# Patient Record
Sex: Male | Born: 1955 | Race: White | Hispanic: No | Marital: Single
Health system: Southern US, Community
[De-identification: ages and names within clinical notes are randomized; demographics above are authoritative.]

## PROBLEM LIST (undated history)

## (undated) DIAGNOSIS — K219 Gastro-esophageal reflux disease without esophagitis: Secondary | ICD-10-CM

## (undated) DIAGNOSIS — I10 Essential (primary) hypertension: Secondary | ICD-10-CM

## (undated) DIAGNOSIS — E119 Type 2 diabetes mellitus without complications: Secondary | ICD-10-CM

## (undated) DIAGNOSIS — E785 Hyperlipidemia, unspecified: Secondary | ICD-10-CM

---

## 1998-09-09 ENCOUNTER — Inpatient Hospital Stay (HOSPITAL_COMMUNITY): Admission: AD | Admit: 1998-09-09 | Discharge: 1998-09-10 | Payer: Self-pay | Admitting: Cardiology

## 2009-04-06 ENCOUNTER — Ambulatory Visit: Payer: Self-pay | Admitting: Cardiology

## 2009-04-07 ENCOUNTER — Encounter: Payer: Self-pay | Admitting: Cardiology

## 2009-04-28 ENCOUNTER — Telehealth (INDEPENDENT_AMBULATORY_CARE_PROVIDER_SITE_OTHER): Payer: Self-pay | Admitting: *Deleted

## 2009-05-27 ENCOUNTER — Ambulatory Visit: Payer: Self-pay | Admitting: Cardiology

## 2009-05-27 DIAGNOSIS — I2510 Atherosclerotic heart disease of native coronary artery without angina pectoris: Secondary | ICD-10-CM | POA: Insufficient documentation

## 2009-05-27 DIAGNOSIS — I10 Essential (primary) hypertension: Secondary | ICD-10-CM

## 2009-07-06 ENCOUNTER — Telehealth (INDEPENDENT_AMBULATORY_CARE_PROVIDER_SITE_OTHER): Payer: Self-pay | Admitting: *Deleted

## 2010-06-28 NOTE — Progress Notes (Signed)
Summary: RX REFILL TOPROL XL       New/Updated Medications: TOPROL XL 25 MG XR24H-TAB (METOPROLOL SUCCINATE) Take 1 tablet by mouth once a day Prescriptions: TOPROL XL 25 MG XR24H-TAB (METOPROLOL SUCCINATE) Take 1 tablet by mouth once a day  #30 x 6   Entered by:   Carlye Grippe   Authorized by:   Loreli Slot, MD, [facility]   Signed by:   Carlye Grippe on 07/06/2009   Method used:   Electronically to        [facility] (retail)       [address]       [place], [place]  [address]       Ph: [phone]       Fax: [phone]   RxID:   [id number]

## 2010-06-28 NOTE — Letter (Signed)
Summary: [facility] D/C DR. Donzetta Sprung  [facility] D/C DR. Donzetta Sprung   Imported By: Zachary George 05/26/2009 15:40:02  _____________________________________________________________________  External Attachment:    Type:   Image     Comment:   External Document

## 2015-01-12 ENCOUNTER — Inpatient Hospital Stay (HOSPITAL_COMMUNITY)
Admission: AD | Admit: 2015-01-12 | Discharge: 2015-01-28 | DRG: 070 | Disposition: E | Payer: BLUE CROSS/BLUE SHIELD | Source: Other Acute Inpatient Hospital | Attending: Pulmonary Disease | Admitting: Pulmonary Disease

## 2015-01-12 DIAGNOSIS — A419 Sepsis, unspecified organism: Secondary | ICD-10-CM | POA: Diagnosis not present

## 2015-01-12 DIAGNOSIS — E2740 Unspecified adrenocortical insufficiency: Secondary | ICD-10-CM | POA: Diagnosis not present

## 2015-01-12 DIAGNOSIS — I2699 Other pulmonary embolism without acute cor pulmonale: Secondary | ICD-10-CM | POA: Diagnosis not present

## 2015-01-12 DIAGNOSIS — J9601 Acute respiratory failure with hypoxia: Secondary | ICD-10-CM | POA: Diagnosis not present

## 2015-01-12 DIAGNOSIS — F1010 Alcohol abuse, uncomplicated: Secondary | ICD-10-CM | POA: Diagnosis not present

## 2015-01-12 DIAGNOSIS — J9690 Respiratory failure, unspecified, unspecified whether with hypoxia or hypercapnia: Secondary | ICD-10-CM

## 2015-01-12 DIAGNOSIS — I959 Hypotension, unspecified: Secondary | ICD-10-CM | POA: Diagnosis present

## 2015-01-12 DIAGNOSIS — E785 Hyperlipidemia, unspecified: Secondary | ICD-10-CM | POA: Diagnosis not present

## 2015-01-12 DIAGNOSIS — Z794 Long term (current) use of insulin: Secondary | ICD-10-CM | POA: Diagnosis not present

## 2015-01-12 DIAGNOSIS — R110 Nausea: Secondary | ICD-10-CM

## 2015-01-12 DIAGNOSIS — G9341 Metabolic encephalopathy: Secondary | ICD-10-CM | POA: Diagnosis present

## 2015-01-12 DIAGNOSIS — R001 Bradycardia, unspecified: Secondary | ICD-10-CM | POA: Diagnosis not present

## 2015-01-12 DIAGNOSIS — I248 Other forms of acute ischemic heart disease: Secondary | ICD-10-CM | POA: Diagnosis present

## 2015-01-12 DIAGNOSIS — E119 Type 2 diabetes mellitus without complications: Secondary | ICD-10-CM | POA: Diagnosis not present

## 2015-01-12 DIAGNOSIS — E1011 Type 1 diabetes mellitus with ketoacidosis with coma: Secondary | ICD-10-CM | POA: Diagnosis not present

## 2015-01-12 DIAGNOSIS — Z7982 Long term (current) use of aspirin: Secondary | ICD-10-CM | POA: Diagnosis not present

## 2015-01-12 DIAGNOSIS — E876 Hypokalemia: Secondary | ICD-10-CM | POA: Diagnosis not present

## 2015-01-12 DIAGNOSIS — I10 Essential (primary) hypertension: Secondary | ICD-10-CM | POA: Diagnosis not present

## 2015-01-12 DIAGNOSIS — E872 Acidosis: Secondary | ICD-10-CM | POA: Diagnosis not present

## 2015-01-12 DIAGNOSIS — R4182 Altered mental status, unspecified: Secondary | ICD-10-CM | POA: Diagnosis present

## 2015-01-12 DIAGNOSIS — I469 Cardiac arrest, cause unspecified: Secondary | ICD-10-CM | POA: Diagnosis not present

## 2015-01-12 DIAGNOSIS — E1110 Type 2 diabetes mellitus with ketoacidosis without coma: Secondary | ICD-10-CM | POA: Diagnosis present

## 2015-01-12 DIAGNOSIS — K7460 Unspecified cirrhosis of liver: Secondary | ICD-10-CM | POA: Diagnosis present

## 2015-01-12 DIAGNOSIS — G9340 Encephalopathy, unspecified: Secondary | ICD-10-CM | POA: Diagnosis not present

## 2015-01-12 DIAGNOSIS — D696 Thrombocytopenia, unspecified: Secondary | ICD-10-CM | POA: Diagnosis not present

## 2015-01-12 DIAGNOSIS — K219 Gastro-esophageal reflux disease without esophagitis: Secondary | ICD-10-CM | POA: Diagnosis present

## 2015-01-12 DIAGNOSIS — E0810 Diabetes mellitus due to underlying condition with ketoacidosis without coma: Secondary | ICD-10-CM | POA: Diagnosis not present

## 2015-01-12 DIAGNOSIS — Z01818 Encounter for other preprocedural examination: Secondary | ICD-10-CM

## 2015-01-12 DIAGNOSIS — Z4659 Encounter for fitting and adjustment of other gastrointestinal appliance and device: Secondary | ICD-10-CM

## 2015-01-12 DIAGNOSIS — J988 Other specified respiratory disorders: Secondary | ICD-10-CM | POA: Diagnosis not present

## 2015-01-12 DIAGNOSIS — R40241 Glasgow coma scale score 13-15: Secondary | ICD-10-CM | POA: Diagnosis not present

## 2015-01-12 HISTORY — DX: Essential (primary) hypertension: I10

## 2015-01-12 HISTORY — DX: Type 2 diabetes mellitus without complications: E11.9

## 2015-01-12 HISTORY — DX: Gastro-esophageal reflux disease without esophagitis: K21.9

## 2015-01-12 HISTORY — DX: Hyperlipidemia, unspecified: E78.5

## 2015-01-13 ENCOUNTER — Inpatient Hospital Stay (HOSPITAL_COMMUNITY): Payer: BLUE CROSS/BLUE SHIELD

## 2015-01-13 ENCOUNTER — Encounter (HOSPITAL_COMMUNITY): Payer: Self-pay | Admitting: Radiology

## 2015-01-13 DIAGNOSIS — E1110 Type 2 diabetes mellitus with ketoacidosis without coma: Secondary | ICD-10-CM | POA: Diagnosis present

## 2015-01-13 DIAGNOSIS — E1011 Type 1 diabetes mellitus with ketoacidosis with coma: Secondary | ICD-10-CM

## 2015-01-13 DIAGNOSIS — R6520 Severe sepsis without septic shock: Secondary | ICD-10-CM

## 2015-01-13 DIAGNOSIS — J9601 Acute respiratory failure with hypoxia: Secondary | ICD-10-CM

## 2015-01-13 DIAGNOSIS — G9340 Encephalopathy, unspecified: Secondary | ICD-10-CM

## 2015-01-13 DIAGNOSIS — A419 Sepsis, unspecified organism: Secondary | ICD-10-CM

## 2015-01-13 DIAGNOSIS — R40241 Glasgow coma scale score 13-15: Secondary | ICD-10-CM

## 2015-01-13 DIAGNOSIS — R4182 Altered mental status, unspecified: Secondary | ICD-10-CM | POA: Insufficient documentation

## 2015-01-13 LAB — PROCALCITONIN: Procalcitonin: <0.1 ng/mL

## 2015-01-13 LAB — CBC WITH DIFFERENTIAL/PLATELET
BASOS PCT: 0 % (ref 0–1)
Basophils Absolute: 0 10*3/uL (ref 0.0–0.1)
Eosinophils Absolute: 0 10*3/uL (ref 0.0–0.7)
Eosinophils Relative: 0 % (ref 0–5)
HEMATOCRIT: 42.5 % (ref 39.0–52.0)
HEMOGLOBIN: 15.4 g/dL (ref 13.0–17.0)
LYMPHS ABS: 1.9 10*3/uL (ref 0.7–4.0)
LYMPHS PCT: 19 % (ref 12–46)
MCH: 31.2 pg (ref 26.0–34.0)
MCHC: 36.2 g/dL — AB (ref 30.0–36.0)
MCV: 86 fL (ref 78.0–100.0)
MONO ABS: 1.2 10*3/uL — AB (ref 0.1–1.0)
MONOS PCT: 12 % (ref 3–12)
NEUTROS ABS: 7 10*3/uL (ref 1.7–7.7)
NEUTROS PCT: 69 % (ref 43–77)
Platelets: 193 10*3/uL (ref 150–400)
RBC: 4.94 MIL/uL (ref 4.22–5.81)
RDW: 13.2 % (ref 11.5–15.5)
WBC: 10.1 10*3/uL (ref 4.0–10.5)

## 2015-01-13 LAB — URINALYSIS, ROUTINE W REFLEX MICROSCOPIC
Glucose, UA: >1000 mg/dL — AB
Ketones, ur: >80 mg/dL — AB
Leukocytes, UA: NEGATIVE
NITRITE: NEGATIVE
PH: 6.5 (ref 5.0–8.0)
Protein, ur: 100 mg/dL — AB
SPECIFIC GRAVITY, URINE: 1.015 (ref 1.005–1.030)
UROBILINOGEN UA: 1 mg/dL (ref 0.0–1.0)

## 2015-01-13 LAB — MAGNESIUM
MAGNESIUM: 1.7 mg/dL (ref 1.7–2.4)
MAGNESIUM: 1.6 mg/dL — AB (ref 1.7–2.4)
Magnesium: 1.7 mg/dL (ref 1.7–2.4)
Magnesium: 1.6 mg/dL — ABNORMAL LOW (ref 1.7–2.4)

## 2015-01-13 LAB — COMPREHENSIVE METABOLIC PANEL WITH GFR
ALT: 16 U/L — ABNORMAL LOW (ref 17–63)
AST: 20 U/L (ref 15–41)
Albumin: 3.3 g/dL — ABNORMAL LOW (ref 3.5–5.0)
Alkaline Phosphatase: 56 U/L (ref 38–126)
Anion gap: 14 (ref 5–15)
BUN: 8 mg/dL (ref 6–20)
CO2: 21 mmol/L — ABNORMAL LOW (ref 22–32)
Calcium: 8.7 mg/dL — ABNORMAL LOW (ref 8.9–10.3)
Chloride: 111 mmol/L (ref 101–111)
Creatinine, Ser: 0.99 mg/dL (ref 0.61–1.24)
GFR calc Af Amer: >60 mL/min
GFR calc non Af Amer: >60 mL/min
Glucose, Bld: 311 mg/dL — ABNORMAL HIGH (ref 65–99)
Potassium: 3.5 mmol/L (ref 3.5–5.1)
Sodium: 146 mmol/L — ABNORMAL HIGH (ref 135–145)
Total Bilirubin: 1.5 mg/dL — ABNORMAL HIGH (ref 0.3–1.2)
Total Protein: 5.9 g/dL — ABNORMAL LOW (ref 6.5–8.1)

## 2015-01-13 LAB — LACTIC ACID, PLASMA: Lactic Acid, Venous: 1.1 mmol/L (ref 0.5–2.0)

## 2015-01-13 LAB — CG4 I-STAT (LACTIC ACID): Lactic Acid, Venous: 0.64 mmol/L (ref 0.5–2.0)

## 2015-01-13 LAB — CSF CELL COUNT WITH DIFFERENTIAL
EOS CSF: NONE SEEN % (ref 0–1)
Eosinophils, CSF: NONE SEEN % (ref 0–1)
RBC COUNT CSF: 225 /mm3 — AB
RBC COUNT CSF: 540 /mm3 — AB
Tube #: 1
Tube #: 4
WBC CSF: 1 /mm3 (ref 0–5)
WBC, CSF: 0 /mm3 (ref 0–5)

## 2015-01-13 LAB — BASIC METABOLIC PANEL
ANION GAP: 10 (ref 5–15)
BUN: 10 mg/dL (ref 6–20)
CALCIUM: 8.2 mg/dL — AB (ref 8.9–10.3)
CO2: 21 mmol/L — AB (ref 22–32)
CREATININE: 1.43 mg/dL — AB (ref 0.61–1.24)
Chloride: 112 mmol/L — ABNORMAL HIGH (ref 101–111)
GFR, EST NON AFRICAN AMERICAN: 53 mL/min — AB (ref >60)
Glucose, Bld: 246 mg/dL — ABNORMAL HIGH (ref 65–99)
Potassium: 3.2 mmol/L — ABNORMAL LOW (ref 3.5–5.1)
SODIUM: 143 mmol/L (ref 135–145)

## 2015-01-13 LAB — TROPONIN I
TROPONIN I: 0.54 ng/mL — AB (ref <0.031)
TROPONIN I: 0.4 ng/mL — AB (ref <0.031)
Troponin I: 0.41 ng/mL — ABNORMAL HIGH (ref <0.031)

## 2015-01-13 LAB — CBC
HCT: 39.6 % (ref 39.0–52.0)
HEMOGLOBIN: 14.3 g/dL (ref 13.0–17.0)
MCH: 31 pg (ref 26.0–34.0)
MCHC: 36.1 g/dL — ABNORMAL HIGH (ref 30.0–36.0)
MCV: 85.9 fL (ref 78.0–100.0)
PLATELETS: 200 10*3/uL (ref 150–400)
RBC: 4.61 MIL/uL (ref 4.22–5.81)
RDW: 13.4 % (ref 11.5–15.5)
WBC: 8.8 10*3/uL (ref 4.0–10.5)

## 2015-01-13 LAB — GLUCOSE, CAPILLARY
GLUCOSE-CAPILLARY: 201 mg/dL — AB (ref 65–99)
Glucose-Capillary: 226 mg/dL — ABNORMAL HIGH (ref 65–99)
Glucose-Capillary: 311 mg/dL — ABNORMAL HIGH (ref 65–99)
Glucose-Capillary: 281 mg/dL — ABNORMAL HIGH (ref 65–99)
Glucose-Capillary: 221 mg/dL — ABNORMAL HIGH (ref 65–99)
Glucose-Capillary: 253 mg/dL — ABNORMAL HIGH (ref 65–99)

## 2015-01-13 LAB — BLOOD GAS, ARTERIAL
ACID-BASE DEFICIT: 2.4 mmol/L — AB (ref 0.0–2.0)
Bicarbonate: 21 mEq/L (ref 20.0–24.0)
Drawn by: 252031
O2 Content: 2 L/min
O2 SAT: 97.6 %
PATIENT TEMPERATURE: 100.1
TCO2: 22 mmol/L (ref 0–100)
pCO2 arterial: 32.3 mmHg — ABNORMAL LOW (ref 35.0–45.0)
pH, Arterial: 7.433 (ref 7.350–7.450)
pO2, Arterial: 98.7 mmHg (ref 80.0–100.0)

## 2015-01-13 LAB — PHOSPHORUS
PHOSPHORUS: 2.3 mg/dL — AB (ref 2.5–4.6)
PHOSPHORUS: 2.8 mg/dL (ref 2.5–4.6)
PHOSPHORUS: 2.5 mg/dL (ref 2.5–4.6)
Phosphorus: 2.4 mg/dL — ABNORMAL LOW (ref 2.5–4.6)

## 2015-01-13 LAB — CORTISOL: Cortisol, Plasma: 10 ug/dL

## 2015-01-13 LAB — BETA-HYDROXYBUTYRIC ACID: Beta-Hydroxybutyric Acid: 3.33 mmol/L — ABNORMAL HIGH (ref 0.05–0.27)

## 2015-01-13 LAB — MRSA PCR SCREENING: MRSA BY PCR: NEGATIVE

## 2015-01-13 LAB — PROTEIN AND GLUCOSE, CSF
Glucose, CSF: 167 mg/dL — ABNORMAL HIGH (ref 40–70)
Total  Protein, CSF: 54 mg/dL — ABNORMAL HIGH (ref 15–45)

## 2015-01-13 LAB — VITAMIN B12: Vitamin B-12: 1147 pg/mL — ABNORMAL HIGH (ref 180–914)

## 2015-01-13 LAB — TSH: TSH: 0.97 u[IU]/mL (ref 0.350–4.500)

## 2015-01-13 LAB — URINE MICROSCOPIC-ADD ON

## 2015-01-13 LAB — AMMONIA: Ammonia: 35 umol/L (ref 9–35)

## 2015-01-13 MED ORDER — LACTULOSE 10 GM/15ML PO SOLN
20.0000 g | Freq: Two times a day (BID) | ORAL | Status: DC
  Filled 2015-01-13: qty 30

## 2015-01-13 MED ORDER — SODIUM CHLORIDE 0.9 % IV SOLN
500.0000 mg | Freq: Three times a day (TID) | INTRAVENOUS | Status: DC
  Administered 2015-01-13 – 2015-01-14 (×3): 500 mg via INTRAVENOUS
  Filled 2015-01-13 (×4): qty 500

## 2015-01-13 MED ORDER — SODIUM CHLORIDE 0.9 % IV SOLN
25.0000 ug/h | INTRAVENOUS | Status: DC
  Administered 2015-01-13: 50 ug/h via INTRAVENOUS
  Administered 2015-01-14: 250 ug/h via INTRAVENOUS
  Filled 2015-01-13 (×3): qty 50

## 2015-01-13 MED ORDER — HYDROCORTISONE NA SUCCINATE PF 100 MG IJ SOLR
50.0000 mg | Freq: Four times a day (QID) | INTRAMUSCULAR | Status: DC
  Administered 2015-01-13 – 2015-01-15 (×9): 50 mg via INTRAVENOUS
  Filled 2015-01-13 (×2): qty 1
  Filled 2015-01-13 (×2): qty 2
  Filled 2015-01-13 (×6): qty 1
  Filled 2015-01-13 (×2): qty 2

## 2015-01-13 MED ORDER — VANCOMYCIN HCL 10 G IV SOLR
1500.0000 mg | Freq: Once | INTRAVENOUS | Status: AC
  Administered 2015-01-13: 1500 mg via INTRAVENOUS
  Filled 2015-01-13: qty 1500

## 2015-01-13 MED ORDER — VANCOMYCIN HCL IN DEXTROSE 750-5 MG/150ML-% IV SOLN
750.0000 mg | Freq: Two times a day (BID) | INTRAVENOUS | Status: DC
  Administered 2015-01-13 – 2015-01-14 (×2): 750 mg via INTRAVENOUS
  Filled 2015-01-13 (×3): qty 150

## 2015-01-13 MED ORDER — SODIUM CHLORIDE 0.9 % IV SOLN
INTRAVENOUS | Status: DC
  Administered 2015-01-13: 02:00:00 via INTRAVENOUS

## 2015-01-13 MED ORDER — MIDAZOLAM HCL 2 MG/2ML IJ SOLN
2.0000 mg | INTRAMUSCULAR | Status: DC | PRN
  Administered 2015-01-13: 2 mg via INTRAVENOUS
  Filled 2015-01-13: qty 2

## 2015-01-13 MED ORDER — SODIUM CHLORIDE 0.45 % IV SOLN
INTRAVENOUS | Status: DC
  Administered 2015-01-13 – 2015-01-14 (×3): via INTRAVENOUS

## 2015-01-13 MED ORDER — FENTANYL CITRATE (PF) 100 MCG/2ML IJ SOLN: 50.0000 ug | Freq: Once | INTRAMUSCULAR | Status: DC

## 2015-01-13 MED ORDER — FAMOTIDINE IN NACL 20-0.9 MG/50ML-% IV SOLN
20.0000 mg | Freq: Two times a day (BID) | INTRAVENOUS | Status: DC
  Administered 2015-01-13 – 2015-01-16 (×9): 20 mg via INTRAVENOUS
  Filled 2015-01-13 (×12): qty 50

## 2015-01-13 MED ORDER — ACETAMINOPHEN 160 MG/5ML PO SOLN
650.0000 mg | Freq: Once | ORAL | Status: AC
  Administered 2015-01-13: 650 mg

## 2015-01-13 MED ORDER — POTASSIUM CHLORIDE 20 MEQ/15ML (10%) PO SOLN
40.0000 meq | ORAL | Status: AC
  Administered 2015-01-13 (×2): 40 meq via ORAL
  Filled 2015-01-13 (×2): qty 30

## 2015-01-13 MED ORDER — FOLIC ACID 5 MG/ML IJ SOLN
1.0000 mg | Freq: Every day | INTRAMUSCULAR | Status: DC
  Administered 2015-01-13 – 2015-01-16 (×4): 1 mg via INTRAVENOUS
  Filled 2015-01-13 (×5): qty 0.2

## 2015-01-13 MED ORDER — ANTISEPTIC ORAL RINSE SOLUTION (CORINZ)
7.0000 mL | Freq: Four times a day (QID) | OROMUCOSAL | Status: DC
  Administered 2015-01-13 – 2015-01-16 (×12): 7 mL via OROMUCOSAL

## 2015-01-13 MED ORDER — INSULIN GLARGINE 100 UNIT/ML ~~LOC~~ SOLN
10.0000 [IU] | Freq: Every day | SUBCUTANEOUS | Status: DC
  Administered 2015-01-13: 10 [IU] via SUBCUTANEOUS
  Filled 2015-01-13 (×2): qty 0.1

## 2015-01-13 MED ORDER — THIAMINE HCL 100 MG/ML IJ SOLN
100.0000 mg | Freq: Every day | INTRAMUSCULAR | Status: DC
  Administered 2015-01-13 – 2015-01-16 (×4): 100 mg via INTRAVENOUS
  Filled 2015-01-13 (×5): qty 1

## 2015-01-13 MED ORDER — VITAL HIGH PROTEIN PO LIQD
1000.0000 mL | ORAL | Status: DC
  Filled 2015-01-13 (×2): qty 1000

## 2015-01-13 MED ORDER — ETOMIDATE 2 MG/ML IV SOLN
10.0000 mg | Freq: Once | INTRAVENOUS | Status: AC
  Administered 2015-01-13: 10 mg via INTRAVENOUS

## 2015-01-13 MED ORDER — ACETAMINOPHEN 160 MG/5ML PO SOLN
1000.0000 mg | Freq: Once | ORAL | Status: DC
  Filled 2015-01-13: qty 40

## 2015-01-13 MED ORDER — FENTANYL BOLUS VIA INFUSION
50.0000 ug | INTRAVENOUS | Status: DC | PRN
  Filled 2015-01-13: qty 50

## 2015-01-13 MED ORDER — HEPARIN SODIUM (PORCINE) 5000 UNIT/ML IJ SOLN
5000.0000 [IU] | Freq: Three times a day (TID) | INTRAMUSCULAR | Status: DC
  Administered 2015-01-13 – 2015-01-16 (×13): 5000 [IU] via SUBCUTANEOUS
  Filled 2015-01-13 (×17): qty 1

## 2015-01-13 MED ORDER — SODIUM CHLORIDE 0.9 % IV SOLN
500.0000 mg | Freq: Four times a day (QID) | INTRAVENOUS | Status: DC
  Administered 2015-01-13: 500 mg via INTRAVENOUS
  Filled 2015-01-13 (×3): qty 500

## 2015-01-13 MED ORDER — PRO-STAT SUGAR FREE PO LIQD
30.0000 mL | Freq: Two times a day (BID) | ORAL | Status: DC
  Filled 2015-01-13 (×2): qty 30

## 2015-01-13 MED ORDER — PRO-STAT SUGAR FREE PO LIQD
30.0000 mL | Freq: Two times a day (BID) | ORAL | Status: AC
  Administered 2015-01-13 (×2): 30 mL
  Filled 2015-01-13 (×2): qty 30

## 2015-01-13 MED ORDER — SODIUM CHLORIDE 0.9 % IV SOLN
INTRAVENOUS | Status: DC
  Filled 2015-01-13: qty 2.5

## 2015-01-13 MED ORDER — SODIUM CHLORIDE 0.9 % IV SOLN
INTRAVENOUS | Status: DC
Start: 2015-01-13 — End: 2015-01-13

## 2015-01-13 MED ORDER — ETOMIDATE 2 MG/ML IV SOLN
20.0000 mg | Freq: Once | INTRAVENOUS | Status: DC
Start: 2015-01-13 — End: 2015-01-14

## 2015-01-13 MED ORDER — DEXTROSE-NACL 5-0.45 % IV SOLN: INTRAVENOUS | Status: DC

## 2015-01-13 MED ORDER — ROCURONIUM BROMIDE 50 MG/5ML IV SOLN
1.0000 mg/kg | Freq: Once | INTRAVENOUS | Status: AC
  Administered 2015-01-13: 10 mg via INTRAVENOUS

## 2015-01-13 MED ORDER — FENTANYL CITRATE (PF) 100 MCG/2ML IJ SOLN
INTRAMUSCULAR | Status: AC
  Administered 2015-01-13: 100 ug
  Filled 2015-01-13: qty 2

## 2015-01-13 MED ORDER — SODIUM CHLORIDE 0.9 % IV SOLN
500.0000 mg | Freq: Once | INTRAVENOUS | Status: AC
  Administered 2015-01-13: 500 mg via INTRAVENOUS
  Filled 2015-01-13: qty 500

## 2015-01-13 MED ORDER — LACTULOSE 10 GM/15ML PO SOLN: 20.0000 g | Freq: Two times a day (BID) | ORAL | Status: DC

## 2015-01-13 MED ORDER — MIDAZOLAM HCL 2 MG/2ML IJ SOLN
INTRAMUSCULAR | Status: AC
  Administered 2015-01-13: 2 mg
  Filled 2015-01-13: qty 2

## 2015-01-13 MED ORDER — CHLORHEXIDINE GLUCONATE 0.12% ORAL RINSE (MEDLINE KIT)
15.0000 mL | Freq: Two times a day (BID) | OROMUCOSAL | Status: DC
  Administered 2015-01-13 – 2015-01-16 (×8): 15 mL via OROMUCOSAL

## 2015-01-13 MED ORDER — PHENYLEPHRINE HCL 10 MG/ML IJ SOLN
30.0000 ug/min | INTRAVENOUS | Status: DC
  Administered 2015-01-13: 30 ug/min via INTRAVENOUS
  Filled 2015-01-13 (×2): qty 4

## 2015-01-13 MED ORDER — MIDAZOLAM HCL 2 MG/2ML IJ SOLN
2.0000 mg | INTRAMUSCULAR | Status: DC | PRN
  Administered 2015-01-13: 2 mg via INTRAVENOUS
  Filled 2015-01-13 (×3): qty 2

## 2015-01-13 MED ORDER — SODIUM CHLORIDE 0.9 % IV BOLUS (SEPSIS)
1000.0000 mL | Freq: Once | INTRAVENOUS | Status: AC
  Administered 2015-01-13: 1000 mL via INTRAVENOUS

## 2015-01-13 MED ORDER — VITAL AF 1.2 CAL PO LIQD
1000.0000 mL | ORAL | Status: DC
  Administered 2015-01-13: 1000 mL
  Filled 2015-01-13 (×4): qty 1000

## 2015-01-13 MED ORDER — ASPIRIN 81 MG PO CHEW
81.0000 mg | CHEWABLE_TABLET | Freq: Every day | ORAL | Status: DC
  Administered 2015-01-13 – 2015-01-16 (×2): 81 mg
  Filled 2015-01-13 (×2): qty 1

## 2015-01-13 MED ORDER — ETOMIDATE 2 MG/ML IV SOLN
INTRAVENOUS | Status: AC
  Administered 2015-01-13: 20 mg
  Filled 2015-01-13: qty 10

## 2015-01-13 MED ORDER — PHENYLEPHRINE HCL 10 MG/ML IJ SOLN
30.0000 ug/min | INTRAMUSCULAR | Status: DC
  Administered 2015-01-13: 10 ug/min via INTRAVENOUS
  Administered 2015-01-13: 30 ug/min via INTRAVENOUS
  Filled 2015-01-13 (×2): qty 1

## 2015-01-13 MED ORDER — INSULIN ASPART 100 UNIT/ML ~~LOC~~ SOLN
0.0000 [IU] | SUBCUTANEOUS | Status: DC
  Administered 2015-01-13 (×2): 8 [IU] via SUBCUTANEOUS
  Administered 2015-01-13 (×3): 5 [IU] via SUBCUTANEOUS
  Administered 2015-01-13: 11 [IU] via SUBCUTANEOUS
  Administered 2015-01-14 (×3): 8 [IU] via SUBCUTANEOUS
  Administered 2015-01-14 (×2): 5 [IU] via SUBCUTANEOUS
  Administered 2015-01-14: 8 [IU] via SUBCUTANEOUS
  Administered 2015-01-15: 5 [IU] via SUBCUTANEOUS
  Administered 2015-01-15 (×5): 8 [IU] via SUBCUTANEOUS
  Administered 2015-01-16 (×3): 5 [IU] via SUBCUTANEOUS
  Administered 2015-01-16: 3 [IU] via SUBCUTANEOUS
  Administered 2015-01-16 (×2): 5 [IU] via SUBCUTANEOUS
  Administered 2015-01-17: 2 [IU] via SUBCUTANEOUS
  Administered 2015-01-17: 3 [IU] via SUBCUTANEOUS

## 2015-01-13 NOTE — Procedures (Signed)
Central Venous Catheter Insertion Procedure Note Josias L. Parada [id number] [birth year]  Procedure: Insertion of Central Venous Catheter Indications: Assessment of intravascular volume, Drug and/or fluid administration and Frequent blood sampling  Procedure Details Consent: Risks of procedure as well as the alternatives and risks of each were explained to the (patient/caregiver).  Consent for procedure obtained. Time Out: Verified patient identification, verified procedure, site/side was marked, verified correct patient position, special equipment/implants available, medications/allergies/relevent history reviewed, required imaging and test results available.  Performed  Maximum sterile technique was used including antiseptics, cap, gloves, gown, hand hygiene, mask and sheet. Skin prep: Chlorhexidine; local anesthetic administered A antimicrobial bonded/coated triple lumen catheter was placed in the right internal jugular vein using the Seldinger technique.  Evaluation Blood flow good Complications: No apparent complications Patient did tolerate procedure well. Chest X-ray ordered to verify placement.  CXR: pending.  Nelda Bucks 01/13/2015, 11:36 AM  [place]  Mcarthur Rossetti. Tyson Alias, MD, FACP Pgr: [phone] [facility]

## 2015-01-13 NOTE — Consult Note (Signed)
NEURO HOSPITALIST CONSULT NOTE   Referring physician: Mannam   Reason for Consult: AMS  HPI:                                                                                                                                          Tony French is an 59 y.o. male admitted to [facility] 01/09/15 with DKA with a BG of 400 after he stopped taking his insulin.  On Jan 23, 2015, he didn't have much improvement and family requested transfer to [facility] ICU. He arrived at [facility] ICU 2015/01/23 and remained altered and barely responsive with GCS of 6. He was subsequently intubated for airway protection. While at [facility] there has been concern for aspiration PNA with BC/UC and sputum culture pending. Today he was noted to become hypotensive 53/43 requiring bolus of fluid and spiked at temperature of 103. BG has been decreased to 281.    LP performed and revealed a glucose 167, protein 54, RBC 540/225, WBC 0/1 with no lymph, segs, monocytes.  AST 20/ATL 16/ ammonia 35.  MRI brain pending. EEG pending CXR shows no PNA CT head--no acute abnormality  Neurology consulted due to AMS  Past Medical History  Diagnosis Date  . DM (diabetes mellitus)   . HTN (hypertension)   . HLD (hyperlipidemia)   . GERD (gastroesophageal reflux disease)     No past surgical history on file.  Family History  Problem Relation Age of Onset  . Hypertension Mother   . Hyperlipidemia Mother   . Hyperlipidemia Father   . Hypertension Father     Social History:  has no tobacco, alcohol, and drug history on file.  Allergies  Allergen Reactions  . Statins Other (See Comments)    Myalgias  . Keflex [Cephalexin] Rash  . Penicillins Rash    MEDICATIONS:                                                                                                                     Prior to Admission:  No prescriptions prior to admission   Scheduled: . antiseptic oral rinse  7 mL Mouth Rinse QID  . aspirin  81 mg Per Tube  Daily  . chlorhexidine gluconate  15 mL Mouth Rinse BID  . etomidate  20 mg Intravenous  Once  . famotidine (PEPCID) IV  20 mg Intravenous Q12H  . feeding supplement (PRO-STAT SUGAR FREE 64)  30 mL Per Tube BID  . fentaNYL (SUBLIMAZE) injection  50 mcg Intravenous Once  . folic acid  1 mg Intravenous Daily  . heparin  5,000 Units Subcutaneous 3 times per day  . hydrocortisone sodium succinate  50 mg Intravenous Q6H  . imipenem-cilastatin  500 mg Intravenous 3 times per day  . insulin aspart  0-15 Units Subcutaneous 6 times per day  . insulin glargine  10 Units Subcutaneous QHS  . potassium chloride  40 mEq Oral Q4H  . thiamine  100 mg Intravenous Daily  . vancomycin  750 mg Intravenous Q12H     ROS:                                                                                                                                       History obtained from unobtainable from patient due to mental status   Blood pressure 89/59, pulse 88, temperature 102.4 F (39.1 C), temperature source Oral, resp. rate 16, height 5\' 8"  (1.727 m), weight 84.3 kg (185 lb 13.6 oz), SpO2 99 %.   Neurologic Examination:                                                                                                      HEENT-  Normocephalic, no lesions, without obvious abnormality.  Normal external eye and conjunctiva.  Normal TM's bilaterally.  Normal auditory canals and external ears. Normal external nose, mucus membranes and septum.  Normal pharynx. Cardiovascular- S1, S2 normal, pulses palpable throughout   Lungs- chest clear, no wheezing, rales, normal symmetric air entry Abdomen- normal findings: bowel sounds normal Extremities- no edema Lymph-no adenopathy palpable Musculoskeletal-no joint tenderness, deformity or swelling Skin-warm and dry, no hyperpigmentation, vitiligo, or suspicious lesions  Neurological Examination Mental Status: Intubated, breathing with the Vent on fentanyl and Neo for  hypotension. Follows no commands.  When agitated tends to pull both arms toward center of chest.  Cranial Nerves: II: Discs flat bilaterally; no blink to threat, pupils equal, round, reactive to light and accommodation III,IV, VI: ptosis not present, oculocephalic maneuver intact V,VII: face symmetric, corneal reflex intact bilaterally VIII: unable to assess IX,X: unable to assess XI: unable to assess [id number] Motor: Moving all extremities spontaneously and able to lift all extremities against gravity strongly.  No focality noted.    Sensory: withdraws to pain in all extremities.  Deep Tendon  Reflexes: 1+ and symmetric throughout in UE and 1+ bilateral LE and no AJ Plantars: Right: upgoing   Left: downgoing Cerebellar: unable to assess Gait: unable to assess      Lab Results: Basic Metabolic Panel:  Recent Labs Lab 01/13/15 0050 01/13/15 0519 01/13/15 1240  NA 146* 143  --   K 3.5 3.2*  --   CL 111 112*  --   CO2 21* 21*  --   GLUCOSE 311* 246*  --   BUN 8 10  --   CREATININE 0.99 1.43*  --   CALCIUM 8.7* 8.2*  --   MG 1.7 1.7 1.6*  PHOS 2.4* 2.3* 2.8    Liver Function Tests:  Recent Labs Lab 01/13/15 0050  AST 20  ALT 16*  ALKPHOS 56  BILITOT 1.5*  PROT 5.9*  ALBUMIN 3.3*   No results for input(s): LIPASE, AMYLASE in the last 168 hours.  Recent Labs Lab 01/13/15 0054  AMMONIA 35    CBC:  Recent Labs Lab 01/13/15 0050 01/13/15 0519  WBC 10.1 8.8  NEUTROABS 7.0  --   HGB 15.4 14.3  HCT 42.5 39.6  MCV 86.0 85.9  PLT 193 200    Cardiac Enzymes:  Recent Labs Lab 01/13/15 0050 01/13/15 0519 01/13/15 1230  TROPONINI 0.41* 0.54* 0.40*    Lipid Panel: No results for input(s): CHOL, TRIG, HDL, CHOLHDL, VLDL, LDLCALC in the last 168 hours.  CBG:  Recent Labs Lab 01/13/15 0004 01/13/15 0412 01/13/15 0734 01/13/15 1157  GLUCAP 311* 226* 201* 281*    Microbiology: Results for orders placed or performed during the hospital  encounter of 01/15/2015  MRSA PCR Screening     Status: None   Collection Time: 01/13/15 12:05 AM  Result Value Ref Range Status   MRSA by PCR NEGATIVE NEGATIVE Final    Comment:        The GeneXpert MRSA Assay (FDA approved for NASAL specimens only), is one component of a comprehensive MRSA colonization surveillance program. It is not intended to diagnose MRSA infection nor to guide or monitor treatment for MRSA infections.   CSF culture with Stat gram stain     Status: None (Preliminary result)   Collection Time: 01/13/15 11:58 AM  Result Value Ref Range Status   Specimen Description CSF  Final   Special Requests NO2 2CC  Final   Gram Stain   Final    CYTOSPIN SMEAR WBC PRESENT, PREDOMINANTLY MONONUCLEAR NO ORGANISMS SEEN    Culture PENDING  Incomplete   Report Status PENDING  Incomplete    Coagulation Studies: No results for input(s): LABPROT, INR in the last 72 hours.  Imaging: Ct Head Wo Contrast  01/13/2015   CLINICAL DATA:  Altered mental status  EXAM: CT HEAD WITHOUT CONTRAST  TECHNIQUE: Contiguous axial images were obtained from the base of the skull through the vertex without intravenous contrast.  COMPARISON:  None.  FINDINGS: Skull and Sinuses: Nasopharyngeal and small bilateral mastoid fluid in the setting of intubation. No sinusitis.  Orbits: No acute abnormality.  Brain: No evidence of acute infarction, hemorrhage, hydrocephalus, or mass lesion/mass effect. Intracranial atherosclerosis seen in the carotids and right vertebral artery.  IMPRESSION: 1. No acute finding. 2. Intracranial atherosclerosis.   Electronically Signed   By: Marnee Spring M.D.   On: 01/13/2015 06:33   Dg Chest Port 1 View  01/13/2015   CLINICAL DATA:  Sepsis.  Central line placement  EXAM: PORTABLE CHEST - 1 VIEW  COMPARISON:  01/13/2015  FINDINGS: Endotracheal tube remains in stable position. NG tube has been repositioned and advanced into the stomach. Right internal jugular central line is  in place with the tip in the SVC. No pneumothorax. Decreasing lung volumes on the left with increasing left base atelectasis. Minimal right base atelectasis. Heart is normal size. No effusions.  IMPRESSION: Right central line tip in the SVC. No pneumothorax. NG tube now seen entering the stomach.  Bibasilar atelectasis, increased on the left.   Electronically Signed   By: Charlett Nose M.D.   On: 01/13/2015 12:10   Dg Chest Port 1 View  01/13/2015   CLINICAL DATA:  Endotracheal tube placement.  Initial encounter.  EXAM: PORTABLE CHEST - 1 VIEW  COMPARISON:  Chest radiograph performed 01/09/2015  FINDINGS: The patient's endotracheal tube is seen ending 3-4 cm above the carina. The patient's enteric tube appears to be coiled overlying the hypopharynx, ending overlying the proximal esophagus.  Mild bibasilar atelectasis is noted. No pleural effusion or pneumothorax is seen.  The cardiomediastinal silhouette is normal in size. No acute osseous abnormalities are identified.  IMPRESSION: 1. Endotracheal tube seen ending 3-4 cm above the carina. 2. Enteric tube appears to be coiled overlying the hypopharynx, ending overlying the proximal esophagus. This should be retracted and repositioned as deemed clinically appropriate. 3. Mild bibasilar atelectasis noted.  Lungs otherwise grossly clear. These results were called by telephone at the time of interpretation on 01/13/2015 at 1:57 am to Nursing on [id number], who verbally acknowledged these results.   Electronically Signed   By: Roanna Raider M.D.   On: 01/13/2015 01:58   Dg Abd Portable 1v  01/13/2015   CLINICAL DATA:  Orogastric tube placement.  Initial encounter.  EXAM: PORTABLE ABDOMEN - 1 VIEW  COMPARISON:  None.  FINDINGS: The patient's enteric tube is seen ending overlying the body of the stomach.  The visualized bowel gas pattern is unremarkable. Scattered air and stool filled loops of colon are seen; no abnormal dilatation of small bowel loops is seen to suggest  small bowel obstruction. No free intra-abdominal air is identified, though evaluation for free air is limited on a single supine view.  The visualized osseous structures are within normal limits; the sacroiliac joints are unremarkable in appearance.  IMPRESSION: Enteric tube seen ending overlying the body of the stomach.   Electronically Signed   By: Roanna Raider M.D.   On: 01/13/2015 02:19   US Abdomen Limited Ruq  01/13/2015   CLINICAL DATA:  Nausea.  EXAM: US ABDOMEN LIMITED - RIGHT UPPER QUADRANT  COMPARISON:  Ultrasound 03/26/2011 .  FINDINGS: Gallbladder:  No gallstones or wall thickening visualized. No sonographic Murphy sign noted.  Common bile duct:  Diameter: 3.1 mm.  Liver:  No focal lesion identified. Within normal limits in parenchymal echogenicity.  IMPRESSION: Negative exam.   Electronically Signed   By: Maisie Fus  Register   On: 01/13/2015 10:29    Felicie Morn PA-C [facility] [phone]  01/13/2015, 2:49 PM  Patient seen and examined.  Clinical course and management discussed.  Necessary edits performed.  I agree with the above.  Assessment and plan of care developed and discussed below.   Assessment/Plan: 59 year old male with altered mental status after presenting in DKA.  Patient also febrile.  Although full neurological examination unable to be performed due to patient being on Fentanyl, no focality noted on neurological examination.  EEG only significant for general background slowing.  Head CT independently reviewed and shows no acute  changes.  LP does not suggest CNS infection.  HSV pending.  No information available as to urine drug screen, etc.  With fever can not rule out the possibility of an encephalopathy related to infection.  Source yet to be determined.     Recommendations: 1.  Consider start of Acyclovir until HSV results are available. 2.  Agree with MRI of the brain 3.  Agree with continued search for infection source.    Thana Farr,  MD [facility] [phone]  01/13/2015  6:35 PM

## 2015-01-13 NOTE — Progress Notes (Signed)
ANTIBIOTIC CONSULT NOTE - INITIAL  Pharmacy Consult for Imipenem  Indication: Aspiration pneumonia   Allergies not on file  Patient Measurements:   Actual body weight:   Vital Signs: Temp: 100.7 F (38.2 C) (08/17 0005) Temp Source: Rectal (08/17 0005) Intake/Output from previous day:   Intake/Output from this shift:    Labs: No results for input(s): WBC, HGB, PLT, LABCREA, CREATININE in the last 72 hours. CrCl cannot be calculated (Unknown ideal weight.). No results for input(s): VANCOTROUGH, VANCOPEAK, VANCORANDOM, GENTTROUGH, GENTPEAK, GENTRANDOM, TOBRATROUGH, TOBRAPEAK, TOBRARND, AMIKACINPEAK, AMIKACINTROU, AMIKACIN in the last 72 hours.   Microbiology: No results found for this or any previous visit (from the past 720 hour(s)).  Medical History: No past medical history on file.  Medications:  No prescriptions prior to admission   Assessment: 45 YOM admitted to [facility] on 01/09/15 for DKA and transferred to [facility] ICU for lack of improvement. Currently remains altered and barely responsive. Pharmacy consulted to start imipenem for aspiration pneumonia with allergy to penicillins. WBC wnl. Tm 100.33F. CrCl ~ 88 mL/min   8/17 BCx2>> 8/17 Trach asp>>   Goal of Therapy:  Resolution of infection   Plan:  -Start Imipenem 500 mg IV Q 6 hours -Monitor CBC, renal fx, cultures and clinical progress   Vinnie Level, PharmD., BCPS Clinical Pharmacist Pager [phone]

## 2015-01-13 NOTE — Procedures (Signed)
Arterial Catheter Insertion Procedure Note Tony French [id number] 02/16/1956  Procedure: Insertion of Arterial Catheter  Indications: Blood pressure monitoring  Procedure Details Consent: Risks of procedure as well as the alternatives and risks of each were explained to the (patient/caregiver).  Consent for procedure obtained. and Unable to obtain consent because of emergent medical necessity. Time Out: Verified patient identification, verified procedure, site/side was marked, verified correct patient position, special equipment/implants available, medications/allergies/relevent history reviewed, required imaging and test results available.  Performed  Maximum sterile technique was used including antiseptics, cap, gloves, gown, hand hygiene, mask and sheet. Skin prep: Chlorhexidine; local anesthetic administered 20 gauge catheter was inserted into right radial artery using the Seldinger technique.  Evaluation Blood flow good; BP tracing good. Complications: No apparent complications.   Tony French 01/13/2015

## 2015-01-13 NOTE — Progress Notes (Addendum)
ANTIBIOTIC CONSULT NOTE - INITIAL  Pharmacy Consult for Imipenem + Vancomycin  Indication: empiric meningitis coverage  Allergies  Allergen Reactions  . Statins Other (See Comments)    Myalgias  . Keflex [Cephalexin] Rash  . Penicillins Rash    Patient Measurements: Height:  (172.7 cm) Weight: 185 lb 13.6 oz (84.3 kg) IBW/kg (Calculated) : 68.4  Vital Signs: Temp: 103 F (39.4 C) (08/17 0738) Temp Source: Oral (08/17 0738) BP: 91/59 mmHg (08/17 0915) Pulse Rate: 100 (08/17 0915) Intake/Output from previous day: 08/16 0701 - 08/17 0700 In: 988.6 [I.V.:838.6; IV Piggyback:150] Out: 550 [Urine:550] Intake/Output from this shift: Total I/O In: 515 [I.V.:365; IV Piggyback:150] Out: -   Labs:  Recent Labs  01/13/15 0050 01/13/15 0519  WBC 10.1 8.8  HGB 15.4 14.3  PLT 193 200  CREATININE 0.99 1.43*   Estimated Creatinine Clearance: 61 mL/min (by C-G formula based on Cr of 1.43). No results for input(s): VANCOTROUGH, VANCOPEAK, VANCORANDOM, GENTTROUGH, GENTPEAK, GENTRANDOM, TOBRATROUGH, TOBRAPEAK, TOBRARND, AMIKACINPEAK, AMIKACINTROU, AMIKACIN in the last 72 hours.   Microbiology: Recent Results (from the past 720 hour(s))  MRSA PCR Screening     Status: None   Collection Time: 01/13/15 12:05 AM  Result Value Ref Range Status   MRSA by PCR NEGATIVE NEGATIVE Final    Comment:        The GeneXpert MRSA Assay (FDA approved for NASAL specimens only), is one component of a comprehensive MRSA colonization surveillance program. It is not intended to diagnose MRSA infection nor to guide or monitor treatment for MRSA infections.    Assessment:  80 YOM admitted to [facility] on 01/09/15 for DKA and transferred to [facility] ICU for lack of improvement in mental status. Currently remains altered and barely responsive with rigidity of his right arm.  WBC wnl. Tm 103F. CrCl ~ 61 mL/min   Pharmacy consulted to start imipenem for questionable aspiration pneumonia with allergy  to penicillins, now consulted to add vancomycin for empiric coverage of meningitis.   8/17 BCx2>> 8/17 Trach asp>>  8/17 LP >>  Goal of Therapy:  Resolution of infection  Vancomycin trough goal 15-20  Plan:  -Reduced Imipenem to 500 mg IV q8h d/t decline in renal fxn -Vancomycin 1500 mg IV x1, then 750 mg IV q12h -Vanc trough at Css if therapy is to continue -Monitor CBC, renal fx, cultures and clinical progress   Arcola Jansky, PharmD Clinical Pharmacy Resident Pager: [phone] 01/13/2015,10:41 AM

## 2015-01-13 NOTE — Progress Notes (Signed)
eLink Physician-Brief Progress Note Patient Name: Tony French. Kenley DOB: 07-06-1955 MRN: [id number]   Date of Service  01/13/2015  HPI/Events of Note  Hypotension improved with fluid bolus, but back down now post sedation Lactic acid reassuring  eICU Interventions  Start neosynephrine Place arterial line     Intervention Category Intermediate Interventions: Hypotension - evaluation and management  MCQUAID, DOUGLAS 01/13/2015, 4:35 AM

## 2015-01-13 NOTE — Progress Notes (Signed)
eLink Physician-Brief Progress Note Patient Name: Tony French. Sperbeck DOB: 1956-03-23 MRN: [id number]   Date of Service  01/13/2015  HPI/Events of Note  Blood Cultures positive for GPC's in clusters (1/2 bottles). Patient is currently on Vancomycin and Primaxin which will provide good coverage for GPC's in clusters.   eICU Interventions  Continue current management.      Intervention Category Major Interventions: Infection - evaluation and management  [name] 01/13/2015, 8:12 PM

## 2015-01-13 NOTE — Procedures (Signed)
Lumbar Puncture Procedure Note  Pre-operative Diagnosis: r/o meningitis  Post-operative Diagnosis: unlikely meningitis, r/o encephalitis  Indications: Diagnostic  Procedure Details   Consent: Informed consent was obtained. Risks of the procedure were discussed including: infection, bleeding, pain and headache.  The patient was positioned under sterile conditions. Betadine solution and sterile drapes were utilized. A spinal needle was inserted at the L3 - L4 interspace.  Spinal fluid was obtained and sent to the laboratory.  Findings 8mL of clear spinal fluid was obtained. Opening Pressure: 12cm H2O pressure. Closing Pressure: not measuredcm H2O pressure.  Complications:  None; patient tolerated the procedure well.        Condition: stable  Plan Bed rest for 5 hours.   Mcarthur Rossetti. Tyson Alias, MD, FACP Pgr: [phone] [facility]

## 2015-01-13 NOTE — Progress Notes (Addendum)
Initial Nutrition Assessment  DOCUMENTATION CODES:   Not applicable  INTERVENTION:    Initiate TF via OGT with Vital AF 1.2 at 25 ml/h and Prostat 30 ml BID on day 1; on day 2, d/c Prostat and increase to goal rate of 70 ml/h (1680 ml per day) to provide 2016 kcals, 126 gm protein, 1362 ml free water daily.  NUTRITION DIAGNOSIS:   Inadequate oral intake related to inability to eat as evidenced by NPO status.  GOAL:   Patient will meet greater than or equal to 90% of their needs  MONITOR:   TF tolerance, Vent status, Labs, Weight trends  REASON FOR ASSESSMENT:   Consult Enteral/tube feeding initiation and management  ASSESSMENT:   59 y.o. M admitted to [facility] 01/09/15 with DKA. On 02/03/15, he didn't have much improvement and family requested transfer to [facility] ICU. He arrived at [facility] ICU 02/03/2015 and remained altered and barely responsive with GCS of 6. He was subsequently intubated for airway protection.  Labs reviewed: potassium and phosphorus are low. Nutrition focused physical exam completed.  No muscle or subcutaneous fat depletion noticed.  Patient is currently intubated on ventilator support MV: 8.9 L/min Temp (24hrs), Avg:101.4 F (38.6 C), Min:100.4 F (38 C), Max:103 F (39.4 C)   Diet Order:  Diet NPO time specified  Skin:  Reviewed, no issues  Last BM:  8/17  Height:   Ht Readings from Last 1 Encounters:  01/13/15  (1.727 m)    Weight:   Wt Readings from Last 1 Encounters:  01/13/15 185 lb 13.6 oz (84.3 kg)    Ideal Body Weight:  70 kg  BMI:  Body mass index is 28.26 kg/(m^2).  Estimated Nutritional Needs:   Kcal:  2095  Protein:  110-125 gm  Fluid:  2.2 L  EDUCATION NEEDS:   No education needs identified at this time   Joaquin Courts, RD, LDN, CNSC Pager [phone] After Hours Pager [phone]

## 2015-01-13 NOTE — H&P (Signed)
PULMONARY / CRITICAL CARE MEDICINE   Name: Tony French. Tony French MRN: [id number] DOB: 04/08/1956    ADMISSION DATE:  February 10, 2015 CONSULTATION DATE:  01/13/2015  REFERRING MD :  [facility]  CHIEF COMPLAINT:  AMS  INITIAL PRESENTATION:  59 y.o. M admitted to [facility] 01/09/15 with DKA.  On February 10, 2015, he didn't have much improvement and family requested transfer to [facility] ICU.  He arrived at [facility] ICU 02-10-2015 and remained altered and barely responsive with GCS of 6.  He was subsequently intubated for airway protection.  STUDIES:  CXR 8/17 >>>  SIGNIFICANT EVENTS: 8/13 - admitted to [facility] with DKA. 8/16 - transferred to [facility] ICU.  Intubated for airway protection.   HISTORY OF PRESENT ILLNESS:  Pt is encephalopathic; therefore, this HPI is obtained from chart review. Tony French is a 59 y.o. M with PMH of DM, HLD, HTN, GERD.  He was admitted to [facility] on 01/09/15 for N/V/D.  He apparently had stopped taking his insulin 2 days prior to being admitted at [facility].  On day of admission, he was confused and was unable to eat.  In ED, he was found to have DKA with AG of 40.  He was subsequently admitted to [facility] for further management of his DKA.  On 02-10-2015, family did not feel that pt had much improvement since admission.  They requested that pt be transferred to [facility] for further evaluation and management.  Per discharge summary from [facility], over past 3 days, pt had been extremely agitated and difficult to control.  He required multiple doses of ativan and haldol.  He arrived to [facility] ICU during early AM hours of 01/13/15.  He remained altered and was minimally responsive with a GCS of 6 (Eyes 1, Motor 4, Verbal 1).  He was subsequently intubated for airway protection.  Head CT is pending.   PAST MEDICAL HISTORY : DM, HLD, HTN, GERD.  PTA Meds: aspirin  PO QD, glucophage 1g PO BID, Jardiance  PO QD, Lantus 70u SQ BID, Lisinopril  PO QD, Zetia  PO QD,  Ranitidine  PO BID PRN.  Prior to Admission medications   Not on File   Allergies:  Keflex (rash), Penicillin (rash), HMG COA Reductase Inhibitors (myalgias). FAMILY HISTORY:  No family history on file.  SOCIAL HISTORY:  has no tobacco, alcohol, and drug history on file.  REVIEW OF SYSTEMS:  Unable to obtain as pt is encephalopathic.  SUBJECTIVE:   VITAL SIGNS: Temp:  [100.7 F (38.2 C)] 100.7 F (38.2 C) (08/17 0005) HEMODYNAMICS:   VENTILATOR SETTINGS:   INTAKE / OUTPUT: Intake/Output    None     PHYSICAL EXAMINATION: General: Adult male, minimally responsive. Neuro: Minimally responsive, GCS 6.  RUE rigid. HEENT: [place]/AT. PERRL. Cardiovascular: RRR, no M/R/G.  Lungs: Respirations even and unlabored.  CTA bilaterally, No W/R/R. Abdomen: BS x 4, soft, NT/ND.  Musculoskeletal: No gross deformities, no edema.  Skin: Intact, warm, no rashes.  LABS:  CBC No results for input(s): WBC, HGB, HCT, PLT in the last 168 hours. Coag's No results for input(s): APTT, INR in the last 168 hours. BMET No results for input(s): NA, K, CL, CO2, BUN, CREATININE, GLUCOSE in the last 168 hours. Electrolytes No results for input(s): CALCIUM, MG, PHOS in the last 168 hours. Sepsis Markers No results for input(s): LATICACIDVEN, PROCALCITON, O2SATVEN in the last 168 hours. ABG No results for input(s): PHART, PCO2ART, PO2ART in the last 168 hours. Liver Enzymes No results for input(s): AST,  ALT, ALKPHOS, BILITOT, ALBUMIN in the last 168 hours. Cardiac Enzymes No results for input(s): TROPONINI, PROBNP in the last 168 hours. Glucose No results for input(s): GLUCAP in the last 168 hours.  Imaging No results found.   ASSESSMENT / PLAN:  PULMONARY OETT 8/17 > A: VDRF due to inability to protect airway. ? Aspiration PNA - thick secretions seen in posterior oropharynx during intubation. P:   Full mechanical support, wean as able. VAP bundle. SBT in AM if able. Abx per ID  section. Pulmonary hygiene. CXR in AM.  CARDIOVASCULAR A:  Troponin leak - suspect demand. Hx HTN, HLD. P:  Trend troponins. Monitor hemodynamics. Hold outpatient lisinopril, zetia.  RENAL A:   Mild AG metabolic acidosis. Pseudohypocalcemia - corrects to 9.26.  P:   NS @ 125. Check ionized calcium. BMP in AM.  GASTROINTESTINAL A:   ? Hx cirrhosis. Hx hepatic encephalopathy. Mild hyperbilirubinemia. GERD. Nutrition. P:   RUQ ultrasound. Check ammonia. Pepcid. NPO.  HEMATOLOGIC A:   VTE Prophylaxis. P:  SCD's / Heparin. CBC in AM.  INFECTIOUS A:   Concern for aspiration PNA - thick secretions seen in posterior pharynx during intubation. P:   BCx2 8/17 > UCx 8/17 > Sputum Cx 8/17 > Abx: Imipenem (penicillin allergy), start date 8/17, day 1/x. PCT algorithm to limit abx exposure.  ENDOCRINE A:   DM.  P:   SSI. Hold outpatient  Glucophage, Jardiance, Lantus.  NEUROLOGIC A:   Acute metabolic encephalopathy. RUE rigidity. ETOH use disorder. P:   Sedation:  Fentanyl gtt / Midazolam PRN. RASS goal: 0 to -1. Daily WUA. STAT head CT. May need LP. Check ammonia, TSH, Vitamin B12. EEG. Thiamine / Folate.   Family updated: Wife at bedside.  Interdisciplinary Family Meeting v Palliative Care Meeting:  Due by: 01/19/15.   Tony French, [place] - [facility] [place] [facility] Pager: [phone]  or [phone] 01/13/2015, 12:19 AM   Attending:  I have seen and examined the patient with nurse practitioner/resident and agree with the note above.   Tony French was admitted on 01/09/2015 to [facility] with nausea vomiting and an elevated anion gap and blood sugar with positive serum ketones. This syndrome was felt to be consistent with DKA and he was treated with IV fluids and insulin. His family noted that prior to this he had no fevers, chills, cough, dysuria, rash, or tick bites. Notably for my review from the records from  the other hospital he became more agitated and he was treated with repeated doses of Ativan. He was transferred to our facility on 01/13/2015 with severe agitation. On arrival he was attended and required intubation. His wife states that he only has about 3-4 drinks per week and denies knowledge of hepatic cirrhosis.  On my exam he had a GCS score of 6, he was not protecting his airway, his belly was soft, his lungs were clear his cardiovascular exam was within normal limits  Impression/plan: Acute encephalopathy: The most likely scenario in this situation would be polypharmacy +/-sepsis related to all the benzodiazepine's that he received at the other hospital, but it's worthwhile to work him up for other metabolic causes including cirrhosis, thyroid disease, and acute neurologic conditions. To that end we will get a CT scan of his head, and EEG, repeat an ammonia. He may need a lumbar puncture. Low-grade temperature/sepsis: The most likely scenario considering his confusion would be aspiration, we will check a chest x-ray and  add Unasyn, give IVF, draw cultures Acute respiratory failure with hypoxemia: Due to inability to protect airway, he was intubated.  My critical care time 45 minutes  Heber Palmhurst, MD [place] [facility] Pager: [phone] Cell: [phone] After 3pm or if no response, call [phone]

## 2015-01-13 NOTE — Progress Notes (Signed)
PULMONARY / CRITICAL CARE MEDICINE   Name: Tony French MRN: [id number] DOB: 02/18/1956    ADMISSION DATE:  01/02/2015 CONSULTATION DATE:  01/13/2015  REFERRING MD :  [facility]  CHIEF Uri TurnboughINT:  AMS  INITIAL PRESENTATION:  59 y.o. M admitted to [facility] 01/09/15 with DKA.  On 01/07/2015, he didn't have much improvement and family requested transfer to [facility] ICU.  He arrived at [facility] ICU 12/28/2014 and remained altered and barely responsive with GCS of 6.  He was subsequently intubated for airway protection.  STUDIES:  CXR 8/17 >>>  SIGNIFICANT EVENTS: 8/13 - admitted to [facility] with DKA. 8/16 - transferred to [facility] ICU.  Intubated for airway protection.  SUBJECTIVE: fever  VITAL SIGNS: Temp:  [100.4 F (38 C)-103 F (39.4 C)] 103 F (39.4 C) (08/17 0738) Pulse Rate:  [100-133] 100 (08/17 0915) Resp:  [18-27] 20 (08/17 0915) BP: (53-149)/(34-109) 91/59 mmHg (08/17 0915) SpO2:  [96 %-100 %] 100 % (08/17 0915) Arterial Line BP: (80-160)/(48-79) 95/55 mmHg (08/17 0930) FiO2 (%):  [50 %-100 %] 60 % (08/17 0900) Weight:  [84.3 kg (185 lb 13.6 oz)] 84.3 kg (185 lb 13.6 oz) (08/17 0415) HEMODYNAMICS:   VENTILATOR SETTINGS: Vent Mode:  [-] PRVC FiO2 (%):  [50 %-100 %] 60 % Set Rate:  [20 bmp] 20 bmp Vt Set:  [550 mL] 550 mL PEEP:  [5 cmH20] 5 cmH20 Plateau Pressure:  [15 cmH20-17 cmH20] 15 cmH20 INTAKE / OUTPUT: Intake/Output      08/16 0701 - 08/17 0700 08/17 0701 - 08/18 0700   I.V. (mL/kg) 838.6 (9.9) 365 (4.3)   IV Piggyback 150 150   Total Intake(mL/kg) 988.6 (11.7) 515 (6.1)   Urine (mL/kg/hr) 550    Total Output 550     Net +438.6 +515          PHYSICAL EXAMINATION: General: Adult male, minimally responsive. Neuro: Minimally responsive,  RUE rigid HEENT: [place]/AT. PERRL. Cardiovascular: RRR, no M/R/G.  Lungs: Respirations even and unlabored.  CTA bilaterally, No W/R/R. Abdomen: BS x 4, soft, NT/ND.  Musculoskeletal: No gross deformities, no edema  Skin: Intact,  warm, no rashes.  LABS:  CBC  Recent Labs Lab 01/13/15 0050 01/13/15 0519  WBC 10.1 8.8  HGB 15.4 14.3  HCT 42.5 39.6  PLT 193 200   Coag's No results for input(s): APTT, INR in the last 168 hours. BMET  Recent Labs Lab 01/13/15 0050 01/13/15 0519  NA 146* 143  K 3.5 3.2*  CL 111 112*  CO2 21* 21*  BUN 8 10  CREATININE 0.99 1.43*  GLUCOSE 311* 246*   Electrolytes  Recent Labs Lab 01/13/15 0050 01/13/15 0519  CALCIUM 8.7* 8.2*  MG 1.7 1.7  PHOS 2.4* 2.3*   Sepsis Markers  Recent Labs Lab 01/13/15 0053 01/13/15 0111 01/13/15 0142  LATICACIDVEN 1.1  --  0.64  PROCALCITON  --  <0.10  --    ABG  Recent Labs Lab 01/13/15 0005  PHART 7.433  PCO2ART 32.3*  PO2ART 98.7   Liver Enzymes  Recent Labs Lab 01/13/15 0050  AST 20  ALT 16*  ALKPHOS 56  BILITOT 1.5*  ALBUMIN 3.3*   Cardiac Enzymes  Recent Labs Lab 01/13/15 0050 01/13/15 0519  TROPONINI 0.41* 0.54*   Glucose  Recent Labs Lab 01/13/15 0004 01/13/15 0412 01/13/15 0734  GLUCAP 311* 226* 201*    Imaging Ct Head Wo Contrast  01/13/2015   CLINICAL DATA:  Altered mental status  EXAM: CT HEAD WITHOUT CONTRAST  TECHNIQUE: Contiguous axial images were obtained from the base of the skull through the vertex without intravenous contrast.  COMPARISON:  None.  FINDINGS: Skull and Sinuses: Nasopharyngeal and small bilateral mastoid fluid in the setting of intubation. No sinusitis.  Orbits: No acute abnormality.  Brain: No evidence of acute infarction, hemorrhage, hydrocephalus, or mass lesion/mass effect. Intracranial atherosclerosis seen in the carotids and right vertebral artery.  IMPRESSION: 1. No acute finding. 2. Intracranial atherosclerosis.   Electronically Signed   By: Marnee Spring M.D.   On: 01/13/2015 06:33   Dg Chest Port 1 View  01/13/2015   CLINICAL DATA:  Endotracheal tube placement.  Initial encounter.  EXAM: PORTABLE CHEST - 1 VIEW  COMPARISON:  Chest radiograph performed  01/09/2015  FINDINGS: The patient's endotracheal tube is seen ending 3-4 cm above the carina. The patient's enteric tube appears to be coiled overlying the hypopharynx, ending overlying the proximal esophagus.  Mild bibasilar atelectasis is noted. No pleural effusion or pneumothorax is seen.  The cardiomediastinal silhouette is normal in size. No acute osseous abnormalities are identified.  IMPRESSION: 1. Endotracheal tube seen ending 3-4 cm above the carina. 2. Enteric tube appears to be coiled overlying the hypopharynx, ending overlying the proximal esophagus. This should be retracted and repositioned as deemed clinically appropriate. 3. Mild bibasilar atelectasis noted.  Lungs otherwise grossly clear. These results were called by telephone at the time of interpretation on 01/13/2015 at 1:57 am to Nursing on [facility], who verbally acknowledged these results.   Electronically Signed   By: Roanna Raider M.D.   On: 01/13/2015 01:58   Dg Abd Portable 1v  01/13/2015   CLINICAL DATA:  Orogastric tube placement.  Initial encounter.  EXAM: PORTABLE ABDOMEN - 1 VIEW  COMPARISON:  None.  FINDINGS: The patient's enteric tube is seen ending overlying the body of the stomach.  The visualized bowel gas pattern is unremarkable. Scattered air and stool filled loops of colon are seen; no abnormal dilatation of small bowel loops is seen to suggest small bowel obstruction. No free intra-abdominal air is identified, though evaluation for free air is limited on a single supine view.  The visualized osseous structures are within normal limits; the sacroiliac joints are unremarkable in appearance.  IMPRESSION: Enteric tube seen ending overlying the body of the stomach.   Electronically Signed   By: Roanna Raider M.D.   On: 01/13/2015 02:19     ASSESSMENT / PLAN:  PULMONARY OETT 8/17 > A: VDRF due to inability to protect airway. ? Aspiration PNA - thick secretions seen in posterior oropharynx during intubation. pcxr  unimpressive P:   Full mechanical support ABg reviewed, rate 16 Unable to extubate secondary to neuro Weaning SBT attempt cpap 5 ps 5, some apnea noted, dc'ed  CARDIOVASCULAR A:  Troponin leak - suspect demand. Hx HTN, HLD. Shock, sepsis? P:  Monitor hemodynamics. Hold outpatient lisinopril, zetia. Treat fevers LA reassuring Requires cvp, line, will place cortisol  RENAL A:   Mild nonAG metabolic acidosis. Hypokalemia P:   Change to 1/2 NS with CT 112 Check ionized calcium. BMP in AM Get cvp k supp  GASTROINTESTINAL A:   ? Hx cirrhosis. Hx hepatic encephalopathy. Mild hyperbilirubinemia. GERD. Nutrition. P:   RUQ ultrasound pending Pepcid. Start TF  HEMATOLOGIC A:   VTE Prophylaxis. P:  SCD's / Heparin. CBC in AM. coags wnl at [facility]  INFECTIOUS A:   Concern for aspiration PNA - thick secretions seen in posterior pharynx during intubation. P:  BCx2 8/17 > UCx 8/17 > Sputum Cx 8/17 > Abx: Imipenem (penicillin allergy), start date 8/17>>> vanc 8/17>>> PCT algorithm sent  LP indicated, fever 103, ct head neg  ENDOCRINE A:   DM.  P:   SSI. Hold outpatient  Glucophage, Jardiance Add Lantus 10 units with start TF   NEUROLOGIC A:   Acute metabolic encephalopathy. RUE rigidity. ETOH use disorder. P:   Sedation:  Fentanyl gtt / Midazolam PRN. RASS goal: 0 to -1. Daily WUA. MRi  LP Check ammonia, TSH, Vitamin B12. EEG. Thiamine / Folate. Assess any home antipsychotics taken- none noted  Family updated: Wife at bedside updated  Interdisciplinary Family Meeting v Palliative Care Meeting:  Due by: 01/19/15.  Ccm time 45 min   Mcarthur Rossetti. Tyson Alias, MD, FACP Pgr: [phone] [place] [facility]

## 2015-01-13 NOTE — Procedures (Signed)
ELECTROENCEPHALOGRAM REPORT   Patient: Tony French. [name]      Room #: 53M-03 Age: 59 y.o.        Sex: male Referring Physician: Dr Isaiah Serge Report Date:  01/13/2015        Interpreting Physician: Omelia Blackwater  History: [name]. [name] is an 59 y.o. male admitted with DKA, now with continued AMS  Medications:  Scheduled: . antiseptic oral rinse  7 mL Mouth Rinse QID  . aspirin  81 mg Per Tube Daily  . chlorhexidine gluconate  15 mL Mouth Rinse BID  . etomidate  20 mg Intravenous Once  . famotidine (PEPCID) IV  20 mg Intravenous Q12H  . feeding supplement (PRO-STAT SUGAR FREE 64)  30 mL Per Tube BID  . fentaNYL (SUBLIMAZE) injection  50 mcg Intravenous Once  . folic acid  1 mg Intravenous Daily  . heparin  5,000 Units Subcutaneous 3 times per day  . hydrocortisone sodium succinate  50 mg Intravenous Q6H  . imipenem-cilastatin  500 mg Intravenous 3 times per day  . insulin aspart  0-15 Units Subcutaneous 6 times per day  . insulin glargine  10 Units Subcutaneous QHS  . potassium chloride  40 mEq Oral Q4H  . thiamine  100 mg Intravenous Daily  . vancomycin  750 mg Intravenous Q12H    Conditions of Recording:  This is a 16 channel EEG carried out with the patient in the intubated state.  Description:  The waking background activity consists of a low voltage, symmetrical, fairly well organized, theta activity seen from the parieto-occipital and posterior temporal regions.  Low voltage fast activity, poorly organized, is seen anteriorly and is at times superimposed on more posterior regions.  No focal slowing or epileptiform activity is noted.  Hyperventilation was not performed. Intermittent photic stimulation was not performed.  IMPRESSION: This is an abnormal EEG secondary to general background slowing.  This finding may be seen with a diffuse disturbance that is etiologically nonspecific, but may include a metabolic encephalopathy, among other possibilities.  No epileptiform  activity was noted.     Elspeth Cho, DO [facility] [phone]  If 7pm- 7am, please page neurology on call as listed in AMION. 01/13/2015, 4:31 PM

## 2015-01-13 NOTE — Progress Notes (Signed)
CRITICAL VALUE ALERT  Critical value received:  Gram Positive Cocci in Clusters in Anaerobic bottle   Date of notification: 01/13/15  Time of notification: 1948  Critical value read back: Yes  Nurse who received alert:  Clydia Llano RN MD notified (1st page):  MD Arsenio Loader    Time of first page:  2010  MD notified (2nd page):  Time of second page:  Responding MD: MD Arsenio Loader   Time MD responded: 2010

## 2015-01-13 NOTE — Progress Notes (Signed)
Bedside EEG completed, results pending. 

## 2015-01-13 NOTE — Progress Notes (Signed)
eLink Physician-Brief Progress Note Patient Name: Tony French DOB: 10-10-1955 MRN: [id number]   Date of Service  01/13/2015  HPI/Events of Note  hypotension  eICU Interventions  Saline bolus     Intervention Category Intermediate Interventions: Hypotension - evaluation and management  [name] 01/13/2015, 2:02 AM

## 2015-01-13 NOTE — Procedures (Signed)
Intubation Procedure Note Tony French [id number] 05/17/1956  Procedure: Intubation Indications: Airway protection and maintenance  Procedure Details Consent: Risks of procedure as well as the alternatives and risks of each were explained to the (patient/caregiver).  Consent for procedure obtained. Time Out: Verified patient identification, verified procedure, site/side was marked, verified correct patient position, special equipment/implants available, medications/allergies/relevent history reviewed, required imaging and test results available.  Performed  Drugs:  100 mcg Fentanyl, 2 mg Versed, 10 mg Etomidate, 50 mg Rocuronium. IDL using glidescope x 1 with # 3 blade. Grade 2 view. 7.5 tube visualized passing through vocal cords. Following intubation:  positive color change on ETCO2, condensation seen in endotracheal tube, equal breath sounds bilaterally.  Evaluation Hemodynamic Status: BP stable throughout; O2 sats: stable throughout Patient's Current Condition: stable Complications: No apparent complications Patient did tolerate procedure well. Chest X-ray ordered to verify placement.  CXR: pending.   Rutherford Guys, [place] - C [place] [facility] Pager: [phone]  or [phone] 01/13/2015, 1:40 AM  Attending: I was present for and supervised this procedure.  Heber Kear[place], MD [place] [facility] Pager: [phone] Cell: [phone] After 3pm or if no response, call [phone]

## 2015-01-14 ENCOUNTER — Inpatient Hospital Stay (HOSPITAL_COMMUNITY): Payer: BLUE CROSS/BLUE SHIELD

## 2015-01-14 DIAGNOSIS — J988 Other specified respiratory disorders: Secondary | ICD-10-CM

## 2015-01-14 LAB — COMPREHENSIVE METABOLIC PANEL
ALBUMIN: 2.6 g/dL — AB (ref 3.5–5.0)
ALK PHOS: 43 U/L (ref 38–126)
ALT: 11 U/L — AB (ref 17–63)
AST: 12 U/L — AB (ref 15–41)
Anion gap: 5 (ref 5–15)
BILIRUBIN TOTAL: 0.5 mg/dL (ref 0.3–1.2)
BUN: 28 mg/dL — AB (ref 6–20)
CALCIUM: 7.9 mg/dL — AB (ref 8.9–10.3)
CO2: 22 mmol/L (ref 22–32)
Chloride: 117 mmol/L — ABNORMAL HIGH (ref 101–111)
Creatinine, Ser: 1.09 mg/dL (ref 0.61–1.24)
GFR calc Af Amer: >60 mL/min (ref >60)
GFR calc non Af Amer: >60 mL/min (ref >60)
GLUCOSE: 332 mg/dL — AB (ref 65–99)
Potassium: 4.5 mmol/L (ref 3.5–5.1)
SODIUM: 144 mmol/L (ref 135–145)
TOTAL PROTEIN: 5 g/dL — AB (ref 6.5–8.1)

## 2015-01-14 LAB — GLUCOSE, CAPILLARY
GLUCOSE-CAPILLARY: 266 mg/dL — AB (ref 65–99)
GLUCOSE-CAPILLARY: 215 mg/dL — AB (ref 65–99)
GLUCOSE-CAPILLARY: 236 mg/dL — AB (ref 65–99)
Glucose-Capillary: 264 mg/dL — ABNORMAL HIGH (ref 65–99)
Glucose-Capillary: 285 mg/dL — ABNORMAL HIGH (ref 65–99)
Glucose-Capillary: 291 mg/dL — ABNORMAL HIGH (ref 65–99)
Glucose-Capillary: 292 mg/dL — ABNORMAL HIGH (ref 65–99)

## 2015-01-14 LAB — POCT I-STAT 3, ART BLOOD GAS (G3+)
ACID-BASE DEFICIT: 3 mmol/L — AB (ref 0.0–2.0)
Bicarbonate: 23.1 mEq/L (ref 20.0–24.0)
O2 SAT: 98 %
PH ART: 7.33 — AB (ref 7.350–7.450)
TCO2: 24 mmol/L (ref 0–100)
pCO2 arterial: 44.2 mmHg (ref 35.0–45.0)
pO2, Arterial: 109 mmHg — ABNORMAL HIGH (ref 80.0–100.0)

## 2015-01-14 LAB — CBC WITH DIFFERENTIAL/PLATELET
BASOS ABS: 0 10*3/uL (ref 0.0–0.1)
BASOS PCT: 0 % (ref 0–1)
EOS PCT: 0 % (ref 0–5)
Eosinophils Absolute: 0 10*3/uL (ref 0.0–0.7)
HCT: 34.1 % — ABNORMAL LOW (ref 39.0–52.0)
Hemoglobin: 11.6 g/dL — ABNORMAL LOW (ref 13.0–17.0)
Lymphocytes Relative: 14 % (ref 12–46)
Lymphs Abs: 1.3 10*3/uL (ref 0.7–4.0)
MCH: 30.2 pg (ref 26.0–34.0)
MCHC: 34 g/dL (ref 30.0–36.0)
MCV: 88.8 fL (ref 78.0–100.0)
MONO ABS: 0.9 10*3/uL (ref 0.1–1.0)
MONOS PCT: 10 % (ref 3–12)
Neutro Abs: 7.2 10*3/uL (ref 1.7–7.7)
Neutrophils Relative %: 76 % (ref 43–77)
PLATELETS: 130 10*3/uL — AB (ref 150–400)
RBC: 3.84 MIL/uL — ABNORMAL LOW (ref 4.22–5.81)
RDW: 14 % (ref 11.5–15.5)
WBC: 9.5 10*3/uL (ref 4.0–10.5)

## 2015-01-14 LAB — HERPES SIMPLEX VIRUS(HSV) DNA BY PCR
HSV 1 DNA: NEGATIVE
HSV 2 DNA: NEGATIVE

## 2015-01-14 LAB — PROCALCITONIN: Procalcitonin: <0.1 ng/mL

## 2015-01-14 LAB — CALCIUM, IONIZED: CALCIUM, IONIZED, SERUM: 4.9 mg/dL (ref 4.5–5.6)

## 2015-01-14 LAB — TRIGLYCERIDES: TRIGLYCERIDES: 126 mg/dL (ref <150)

## 2015-01-14 LAB — HIV ANTIBODY (ROUTINE TESTING W REFLEX): HIV SCREEN 4TH GENERATION: NONREACTIVE

## 2015-01-14 MED ORDER — INSULIN GLARGINE 100 UNIT/ML ~~LOC~~ SOLN
15.0000 [IU] | Freq: Every day | SUBCUTANEOUS | Status: DC
  Administered 2015-01-14: 15 [IU] via SUBCUTANEOUS
  Filled 2015-01-14 (×2): qty 0.15

## 2015-01-14 MED ORDER — LORAZEPAM 2 MG/ML IJ SOLN
INTRAMUSCULAR | Status: AC
  Filled 2015-01-14: qty 1

## 2015-01-14 MED ORDER — FENTANYL CITRATE (PF) 100 MCG/2ML IJ SOLN: 100.0000 ug | Freq: Once | INTRAMUSCULAR | Status: DC

## 2015-01-14 MED ORDER — FENTANYL BOLUS VIA INFUSION
50.0000 ug | INTRAVENOUS | Status: DC | PRN
  Filled 2015-01-14: qty 50

## 2015-01-14 MED ORDER — ARTIFICIAL TEARS OP OINT: 1.0000 "application " | TOPICAL_OINTMENT | Freq: Three times a day (TID) | OPHTHALMIC | Status: DC

## 2015-01-14 MED ORDER — MIDAZOLAM HCL 2 MG/2ML IJ SOLN: 2.0000 mg | Freq: Once | INTRAMUSCULAR | Status: DC | PRN

## 2015-01-14 MED ORDER — SODIUM CHLORIDE 0.9 % IV SOLN
1.0000 mg/h | INTRAVENOUS | Status: DC
  Filled 2015-01-14: qty 10

## 2015-01-14 MED ORDER — MIDAZOLAM HCL 2 MG/2ML IJ SOLN
2.0000 mg | Freq: Once | INTRAMUSCULAR | Status: AC
  Administered 2015-01-14: 2 mg via INTRAVENOUS

## 2015-01-14 MED ORDER — HYDRALAZINE HCL 20 MG/ML IJ SOLN
10.0000 mg | INTRAMUSCULAR | Status: DC | PRN
  Administered 2015-01-14: 10 mg via INTRAVENOUS
  Filled 2015-01-14: qty 1

## 2015-01-14 MED ORDER — SODIUM CHLORIDE 0.9 % IV SOLN: 25.0000 ug/h | INTRAVENOUS | Status: DC

## 2015-01-14 MED ORDER — VANCOMYCIN HCL IN DEXTROSE 1-5 GM/200ML-% IV SOLN
1000.0000 mg | Freq: Three times a day (TID) | INTRAVENOUS | Status: DC
  Administered 2015-01-14 – 2015-01-15 (×2): 1000 mg via INTRAVENOUS
  Filled 2015-01-14 (×4): qty 200

## 2015-01-14 MED ORDER — KCL IN DEXTROSE-NACL 20-5-0.45 MEQ/L-%-% IV SOLN
INTRAVENOUS | Status: DC
  Administered 2015-01-14: via INTRAVENOUS
  Administered 2015-01-14: 1000 mL via INTRAVENOUS
  Administered 2015-01-15 – 2015-01-17 (×4): via INTRAVENOUS
  Filled 2015-01-14 (×8): qty 1000

## 2015-01-14 MED ORDER — DEXMEDETOMIDINE HCL IN NACL 200 MCG/50ML IV SOLN
0.4000 ug/kg/h | INTRAVENOUS | Status: DC
  Administered 2015-01-14: 0.7 ug/kg/h via INTRAVENOUS
  Administered 2015-01-14: 0.8 ug/kg/h via INTRAVENOUS
  Administered 2015-01-14: 1.2 ug/kg/h via INTRAVENOUS
  Administered 2015-01-14: 0.2 ug/kg/h via INTRAVENOUS
  Filled 2015-01-14 (×6): qty 50

## 2015-01-14 MED ORDER — PROPOFOL 1000 MG/100ML IV EMUL
0.0000 ug/kg/min | INTRAVENOUS | Status: DC
  Administered 2015-01-14: 10 ug/kg/min via INTRAVENOUS
  Filled 2015-01-14: qty 200
  Filled 2015-01-14: qty 100

## 2015-01-14 MED ORDER — DEXMEDETOMIDINE HCL IN NACL 400 MCG/100ML IV SOLN
0.4000 ug/kg/h | INTRAVENOUS | Status: DC
  Administered 2015-01-14 (×2): 1.2 ug/kg/h via INTRAVENOUS
  Administered 2015-01-15: 0.6 ug/kg/h via INTRAVENOUS
  Administered 2015-01-15: 0.9 ug/kg/h via INTRAVENOUS
  Administered 2015-01-15: 1.2 ug/kg/h via INTRAVENOUS
  Administered 2015-01-15: 0.9 ug/kg/h via INTRAVENOUS
  Administered 2015-01-15 – 2015-01-16 (×2): 1 ug/kg/h via INTRAVENOUS
  Administered 2015-01-16: 0.8 ug/kg/h via INTRAVENOUS
  Administered 2015-01-16 (×2): 1 ug/kg/h via INTRAVENOUS
  Administered 2015-01-16: 0.7 ug/kg/h via INTRAVENOUS
  Filled 2015-01-14 (×7): qty 100
  Filled 2015-01-14: qty 200
  Filled 2015-01-14 (×3): qty 100

## 2015-01-14 MED ORDER — MIDAZOLAM BOLUS VIA INFUSION
2.0000 mg | INTRAVENOUS | Status: DC | PRN
  Filled 2015-01-14: qty 2

## 2015-01-14 MED ORDER — CISATRACURIUM BOLUS VIA INFUSION
10.0000 mg | INTRAVENOUS | Status: DC
  Filled 2015-01-14: qty 10

## 2015-01-14 MED ORDER — SODIUM CHLORIDE 0.9 % IV SOLN
3.0000 ug/kg/min | INTRAVENOUS | Status: DC
  Filled 2015-01-14: qty 20

## 2015-01-14 MED ORDER — SODIUM CHLORIDE 0.9 % IV SOLN
500.0000 mg | Freq: Four times a day (QID) | INTRAVENOUS | Status: DC
  Administered 2015-01-14 – 2015-01-17 (×11): 500 mg via INTRAVENOUS
  Filled 2015-01-14 (×14): qty 500

## 2015-01-14 MED ORDER — FENTANYL CITRATE (PF) 100 MCG/2ML IJ SOLN
50.0000 ug | INTRAMUSCULAR | Status: DC | PRN
  Administered 2015-01-14 – 2015-01-16 (×3): 50 ug via INTRAVENOUS
  Filled 2015-01-14 (×3): qty 2

## 2015-01-14 MED ORDER — FENTANYL CITRATE (PF) 100 MCG/2ML IJ SOLN: 100.0000 ug | Freq: Once | INTRAMUSCULAR | Status: DC | PRN

## 2015-01-14 MED ORDER — LORAZEPAM 2 MG/ML IJ SOLN
1.0000 mg | INTRAMUSCULAR | Status: DC | PRN
  Administered 2015-01-14 (×2): 2 mg via INTRAVENOUS
  Administered 2015-01-14: 4 mg via INTRAVENOUS
  Administered 2015-01-14 – 2015-01-15 (×3): 2 mg via INTRAVENOUS
  Filled 2015-01-14 (×6): qty 1

## 2015-01-14 MED ORDER — LABETALOL HCL 5 MG/ML IV SOLN
10.0000 mg | INTRAVENOUS | Status: DC | PRN
  Administered 2015-01-14 – 2015-01-15 (×2): 10 mg via INTRAVENOUS
  Filled 2015-01-14 (×2): qty 4

## 2015-01-14 MED ORDER — HYDRALAZINE HCL 20 MG/ML IJ SOLN
10.0000 mg | INTRAMUSCULAR | Status: DC | PRN
Start: 2015-01-15 — End: 2015-01-17
  Administered 2015-01-15 (×2): 10 mg via INTRAVENOUS
  Filled 2015-01-14 (×2): qty 1

## 2015-01-14 NOTE — Progress Notes (Addendum)
ANTIBIOTIC CONSULT NOTE - INITIAL  Pharmacy Consult for Imipenem + Vancomycin  Indication: empiric meningitis coverage, GPC bacteremia  Allergies  Allergen Reactions  . Keflex [Cephalexin] Anaphylaxis and Rash    Rxn occurred around time of one of his knee surgeries. "He almost died" per wife  . Penicillins Anaphylaxis and Rash    Rxn occurred around time of one of his knee surgeries. "He almost died" per wife. Tolerates imipenem w/o issue.  . Statins Other (See Comments)    Myalgias    Patient Measurements: Height:  (172.7 cm) Weight: 198 lb 10.2 oz (90.1 kg) IBW/kg (Calculated) : 68.4  Vital Signs: Temp: 101.7 F (38.7 C) (08/18 1100) Temp Source: Rectal (08/18 1100) BP: 113/74 mmHg (08/18 1200) Pulse Rate: 96 (08/18 1200) Intake/Output from previous day: 08/17 0701 - 08/18 0700 In: 4502.6 [I.V.:3152.6; NG/GT:300; IV Piggyback:1050] Out: 385 [Urine:385] Intake/Output from this shift: Total I/O In: 530 [I.V.:471.6; NG/GT:58.3] Out: 320 [Urine:320]  Labs:  Recent Labs  01/13/15 0050 01/13/15 0519 01/14/15 0248  WBC 10.1 8.8 9.5  HGB 15.4 14.3 11.6*  PLT 193 200 130*  CREATININE 0.99 1.43* 1.09   Estimated Creatinine Clearance: 82.5 mL/min (by C-G formula based on Cr of 1.09). No results for input(s): VANCOTROUGH, VANCOPEAK, VANCORANDOM, GENTTROUGH, GENTPEAK, GENTRANDOM, TOBRATROUGH, TOBRAPEAK, TOBRARND, AMIKACINPEAK, AMIKACINTROU, AMIKACIN in the last 72 hours.   Microbiology: Recent Results (from the past 720 hour(s))  MRSA PCR Screening     Status: None   Collection Time: 01/13/15 12:05 AM  Result Value Ref Range Status   MRSA by PCR NEGATIVE NEGATIVE Final    Comment:        The GeneXpert MRSA Assay (FDA approved for NASAL specimens only), is one component of a comprehensive MRSA colonization surveillance program. It is not intended to diagnose MRSA infection nor to guide or monitor treatment for MRSA infections.   Culture, blood (routine x  2)     Status: None (Preliminary result)   Collection Time: 01/13/15  1:12 AM  Result Value Ref Range Status   Specimen Description BLOOD LEFT ARM  Final   Special Requests   Final    BOTTLES DRAWN AEROBIC AND ANAEROBIC 10CC BLUE 7CC RED   Culture  Setup Time   Final    GRAM POSITIVE COCCI IN CLUSTERS IN BOTH AEROBIC AND ANAEROBIC BOTTLES CRITICAL RESULT CALLED TO, READ BACK BY AND VERIFIED WITH: Shirlee Limerick RN 1948 01/13/15 A BROWNING    Culture   Final    STAPHYLOCOCCUS SPECIES (COAGULASE NEGATIVE) THE SIGNIFICANCE OF ISOLATING THIS ORGANISM FROM A SINGLE SET OF BLOOD CULTURES WHEN MULTIPLE SETS ARE DRAWN IS UNCERTAIN. PLEASE NOTIFY THE MICROBIOLOGY DEPARTMENT WITHIN ONE WEEK IF SPECIATION AND SENSITIVITIES ARE REQUIRED.    Report Status PENDING  Incomplete  CSF culture with Stat gram stain     Status: None (Preliminary result)   Collection Time: 01/13/15 11:58 AM  Result Value Ref Range Status   Specimen Description CSF  Final   Special Requests NO2 2CC  Final   Gram Stain   Final    CYTOSPIN SMEAR WBC PRESENT, PREDOMINANTLY MONONUCLEAR NO ORGANISMS SEEN    Culture NO GROWTH 1 DAY  Final   Report Status PENDING  Incomplete   Assessment:  27 YOM admitted to [facility] on 01/09/15 for DKA and transferred to [facility] ICU for lack of improvement in mental status despite resolution of DKA. Pt was extubated today and has been very agitated without sedation. Precedex was restarted for potential EtOH  w/d.  WBC 9.5, remains febrile (tmax 103), PCT <0.1, LA 0.64, CrCl 83 mL/min  Pt is currently on Day 2 of imipenem + vancomycin for ?aspiration pneumonia and empiric meningitis coverage. BCx is now growing GPC in clusters.  Imipenem 8/17 >>  Vancomycin 8/17 >>   OSH BCx 8/13 > NGTD (will incubate until 8/18 @1415 )  OSH UCx 8/13 > negative (final)  MRSA PCR 8/17 > negative BCx2 8/17 > GPC in clusters - pending LP 8/17 > WBC present, no organisms seen - culture pending       LP: RBC 225, WBC  1, clear, colorless, Glucose 167, Protein 54  HIV 8/17 > negative HSV 8/17 > pending  Goal of Therapy:  Resolution of infection  Vancomycin trough goal 15-20  Plan:  -Increased Imipenem to 500 mg IV q6h d/t improvement in renal fxn -Increased Vancomycin to 1000 mg IV q8h d/t improved renal fxn -Vanc trough at Css -Monitor CBC, renal fx, cultures and clinical progress   Arcola Jansky, PharmD Clinical Pharmacy Resident Pager: [phone] 01/14/2015,1:00 PM

## 2015-01-14 NOTE — Progress Notes (Signed)
Notified Elink RN Jeanie in regards to patient's blood pressure being elevated despite giving hydralazine . Pt does have history of HTN, and also pt is not able to follow commands and does have periods of agitation which has an increase on the blood pressure. Pt is on 1.2

## 2015-01-14 NOTE — Progress Notes (Signed)
eLink Physician-Brief Progress Note Patient Name: Tony French. Tony French DOB: 1955/09/04 MRN: [id number]   Date of Service  01/14/2015  HPI/Events of Note  Hypertension. SBP - 160-170.  eICU Interventions  Hydralazine 10 mg IV Q 4 hours PRN SBP > 160 or DBP > 100.     Intervention Category Intermediate Interventions: Hypertension - evaluation and management  Sommer,Steven Eugene 01/14/2015, 5:27 PM

## 2015-01-14 NOTE — Progress Notes (Addendum)
PULMONARY / CRITICAL CARE MEDICINE   Name: Tony French. Crossan MRN: [id number] DOB: 1956-01-09    ADMISSION DATE:  01/27/2015  REFERRING MD :  [facility]  CHIEF COMPLAINT:  AMS  INITIAL PRESENTATION:   59 yo male presented to [facility] with DKA.  Transferred to [facility] on 8/16.  Intubated for airway protection in setting of encephalopathy.  STUDIES:  8/17 EEG >> general slowing 8/17 CT head >> no acute finding 8/17 Abd u/s >> negative exam 8/17 LP >> WBC 1, RBC 225, glucose 167, protein 54 8/18 MRI brain >> no acute process  SIGNIFICANT EVENTS: 8/13 - admitted to [facility] with DKA. 8/16 - transferred to [facility] ICU.  Intubated for airway protection. 8/18 - extubated  SUBJECTIVE:  Tolerated pressure support.  VITAL SIGNS: Temp:  [98.4 F (36.9 C)-102.4 F (39.1 C)] 99.4 F (37.4 C) (08/18 0000) Pulse Rate:  [83-121] 87 (08/18 0600) Resp:  [9-29] 16 (08/18 0600) BP: (79-138)/(43-83) 79/55 mmHg (08/18 0600) SpO2:  [95 %-100 %] 99 % (08/18 0600) Arterial Line BP: (83-173)/(38-83) 90/52 mmHg (08/18 0600) FiO2 (%):  [40 %-60 %] 40 % (08/18 0600) Weight:  [198 lb 10.2 oz (90.1 kg)] 198 lb 10.2 oz (90.1 kg) (08/18 0500) HEMODYNAMICS: CVP:  [11 mmHg] 11 mmHg VENTILATOR SETTINGS: Vent Mode:  [-] PRVC FiO2 (%):  [40 %-60 %] 40 % Set Rate:  [16 bmp-20 bmp] 16 bmp Vt Set:  [550 mL] 550 mL PEEP:  [5 cmH20] 5 cmH20 Plateau Pressure:  [11 cmH20-16 cmH20] 16 cmH20 INTAKE / OUTPUT: Intake/Output      08/17 0701 - 08/18 0700 08/18 0701 - 08/19 0700   I.V. (mL/kg) 3152.6 (35)    NG/GT 300    IV Piggyback 1050    Total Intake(mL/kg) 4502.6 (50)    Urine (mL/kg/hr) 385 (0.2)    Total Output 385     Net +4117.6            PHYSICAL EXAMINATION: General: off pressors Neuro: non purposeful movements HEENT: ETT in place Cardiovascular: regular, no murmur Lungs: basilar crackles Abdomen: soft, non tender Musculoskeletal: no edema  Skin: no rashes  LABS:  CBC  Recent  Labs Lab 01/13/15 0050 01/13/15 0519 01/14/15 0248  WBC 10.1 8.8 9.5  HGB 15.4 14.3 11.6*  HCT 42.5 39.6 34.1*  PLT 193 200 130*   BMET  Recent Labs Lab 01/13/15 0050 01/13/15 0519 01/14/15 0248  NA 146* 143 144  K 3.5 3.2* 4.5  CL 111 112* 117*  CO2 21* 21* 22  BUN 8 10 28*  CREATININE 0.99 1.43* 1.09  GLUCOSE 311* 246* 332*   Electrolytes  Recent Labs Lab 01/13/15 0050 01/13/15 0519 01/13/15 1240 01/13/15 2303 01/14/15 0248  CALCIUM 8.7* 8.2*  --   --  7.9*  MG 1.7 1.7 1.6* 1.6*  --   PHOS 2.4* 2.3* 2.8 2.5  --    Sepsis Markers  Recent Labs Lab 01/13/15 0053 01/13/15 0111 01/13/15 0142 01/13/15 1240 01/14/15 0248  LATICACIDVEN 1.1  --  0.64  --   --   PROCALCITON  --  <0.10  --  <0.10 <0.10   ABG  Recent Labs Lab 01/13/15 0005 01/14/15 0250  PHART 7.433 7.330*  PCO2ART 32.3* 44.2  PO2ART 98.7 109.0*   Liver Enzymes  Recent Labs Lab 01/13/15 0050 01/14/15 0248  AST 20 12*  ALT 16* 11*  ALKPHOS 56 43  BILITOT 1.5* 0.5  ALBUMIN 3.3* 2.6*   Cardiac Enzymes  Recent Labs Lab  01/13/15 0050 01/13/15 0519 01/13/15 1230  TROPONINI 0.41* 0.54* 0.40*   Glucose  Recent Labs Lab 01/13/15 1157 01/13/15 1531 01/13/15 1959 01/14/15 0025 01/14/15 0524 01/14/15 0539  GLUCAP 281* 221* 253* 292* 291* 285*    Imaging Mr Brain Wo Contrast  01/14/2015   CLINICAL DATA:  Initial evaluation for acute altered mental status. Patient currently admitted with DKA. Febrile.  EXAM: MRI HEAD WITHOUT CONTRAST  TECHNIQUE: Multiplanar, multiecho pulse sequences of the brain and surrounding structures were obtained without intravenous contrast.  COMPARISON:  Prior CT from 01/13/2015.  FINDINGS: Mild diffuse prominence of the CSF containing spaces is compatible with generalized cerebral atrophy. Minimal T2/FLAIR hyperintensity within the periventricular white matter likely related to chronic small vessel ischemic disease, very minimal for patient age.   Diffusion-weighted sequence limited by susceptibility artifact at the right parietotemporal scalp. No abnormal foci of restricted diffusion to suggest acute intracranial infarct. Gray-white matter differentiation maintained. Normal intravascular flow voids are preserved. No acute intracranial hemorrhage. Single small chronic micro hemorrhage present within the right basal ganglia, likely related to a tiny remote lacunar infarct.  No mass lesion, midline shift, or mass effect. No parenchymal changes to suggest acute encephalitis. No hydrocephalus. No extra-axial fluid collection.  Craniocervical junction within normal limits. Pituitary gland normal. No acute abnormality about the orbits.  Mild mucosal thickening throughout the paranasal sinuses. Bilateral mastoid effusions noted. Inner ear structures within normal limits.  Bone marrow signal intensity within normal limits. Scalp soft tissues unremarkable.  IMPRESSION: 1. No acute intracranial process identified. No findings to suggest acute encephalitis. 2. Mild generalized age-related cerebral atrophy.   Electronically Signed   By: Rise Mu M.D.   On: 01/14/2015 05:42   Dg Chest Port 1 View  01/14/2015   CLINICAL DATA:  Septic shock. History of diabetes and coronary artery disease.  EXAM: PORTABLE CHEST - 1 VIEW  COMPARISON:  Portable chest x-ray of January 13, 2015  FINDINGS: The lungs are hypoinflated. The endotracheal tube tip lies 3.6 cm above the carina. There is subsegmental atelectasis at the lung bases. There is no pleural effusion or pneumothorax. The cardiac silhouette is largely obscured. The central pulmonary vascularity exhibits crowding. The esophagogastric tube tip projects below the inferior margin of the image. The right internal jugular venous catheter tip projects over the midportion of the SVC.  IMPRESSION: Bilateral hypo nflation with bibasilar atelectasis greater on the left than on the right. No definite CHF. The support tubes  are in reasonable position.   Electronically Signed   By: David  [place] M.D.   On: 01/14/2015 07:43   Dg Chest Port 1 View  01/13/2015   CLINICAL DATA:  Sepsis.  Central line placement  EXAM: PORTABLE CHEST - 1 VIEW  COMPARISON:  01/13/2015  FINDINGS: Endotracheal tube remains in stable position. NG tube has been repositioned and advanced into the stomach. Right internal jugular central line is in place with the tip in the SVC. No pneumothorax. Decreasing lung volumes on the left with increasing left base atelectasis. Minimal right base atelectasis. Heart is normal size. No effusions.  IMPRESSION: Right central line tip in the SVC. No pneumothorax. NG tube now seen entering the stomach.  Bibasilar atelectasis, increased on the left.   Electronically Signed   By: Charlett Nose M.D.   On: 01/13/2015 12:10   US Abdomen Limited Ruq  01/13/2015   CLINICAL DATA:  Nausea.  EXAM: US ABDOMEN LIMITED - RIGHT UPPER QUADRANT  COMPARISON:  Ultrasound 03/26/2011 .  FINDINGS: Gallbladder:  No gallstones or wall thickening visualized. No sonographic Murphy sign noted.  Common bile duct:  Diameter: 3.1 mm.  Liver:  No focal lesion identified. Within normal limits in parenchymal echogenicity.  IMPRESSION: Negative exam.   Electronically Signed   By: Maisie Fus  Register   On: 01/13/2015 10:29     ASSESSMENT / PLAN:  PULMONARY ETT 8/17 > 8/18 A: Compromised airway in setting of acute encephalopathy. P:   Extubated 8/18 Monitor oxygenation Bronchial hygiene F/u CXR intermittently  CARDIOVASCULAR A:  Septic shock. Demand ischemia. Hx of HTN. HLD. P:  Monitor hemodynamics Hold outpatient lisinopril, zetia  RENAL A:   Mild nonAG metabolic acidosis. Hypokalemia. P:   Change IV fluids to D5 1/2 NS with 20 KCL at 75 ml/hr  GASTROINTESTINAL A:   Nutrition. P:   NPO Will need speech assessment when mental status improved Pepcid for SUP  HEMATOLOGIC A:   Thrombocytopenia. P:  F/u CBC SQ heparin for  DVT prevention  INFECTIOUS A:   Sepsis with GPC in clusters in blood cx from 8/17. P:   Day 3 Abx, currently on primaxin/vancomycin  Blood 8/17 >> CSF 8/17 >> CSF HSV 8/17 >>  ENDOCRINE A:   DM with admission for DKA at [facility]. Relative adrenal insufficiency >> cortisol 10 from 8/17. P:   SSI with lantus Continue solu cortef  NEUROLOGIC A:   Acute metabolic encephalopathy. Hx of alcohol abuse. P:   F/u with neurology PRN fentanyl PRN ativan for CIWA > 8 Continue thiamine, folic acid  CC time 40 minutes  Coralyn Helling, MD [facility] Pulmonary/Critical Care 01/14/2015, 8:44 AM Pager:  [phone] After 3pm call: [phone]   Updated pt's wife at bedside >> she denies that he has hx of significant alcohol use.  States he only has social alcohol use.  He is agitated after extubation.  Will add precedex and continue prn ativan/fentanyl.  Coralyn Helling, MD [facility] Pulmonary/Critical Care 01/14/2015, 9:15 AM Pager:  [phone] After 3pm call: [phone]

## 2015-01-14 NOTE — Progress Notes (Signed)
Inpatient Diabetes Program Recommendations  AACE/ADA: New Consensus Statement on Inpatient Glycemic Control (2013)  Target Ranges:  Prepandial:   less than 140 mg/dL      Peak postprandial:   less than 180 mg/dL (1-2 hours)      Critically ill patients:  140 - 180 mg/dL   Review of glycemic control:  Results for Tony French, [name] (MRN [id number]) as of 01/14/2015 09:58  Ref. Range 01/13/2015 19:59 01/14/2015 00:25 01/14/2015 05:24 01/14/2015 05:39 01/14/2015 07:39  Glucose-Capillary Latest Ref Range: 65-99 mg/dL 563 (H) 875 (H) 643 (H) 285 (H) 266 (H)   Diabetes history: Diabetes Mellitus Outpatient Diabetes medications: Lantus 70 units bid,  Metformin 1000 mg bid, Jardiance 25 mg daily  Current orders for Inpatient glycemic control:  Novolog moderate q 4 hours, Lantus 10 units q HS  Note that patient extubated this morning.  Please consider ordering Lantus 20 units bid.    Thanks, Beryl Meager, RN, BC-ADM Inpatient Diabetes Coordinator Pager [phone] (8a-5p)

## 2015-01-14 NOTE — Progress Notes (Signed)
eLink Physician-Brief Progress Note Patient Name: Tony French. [name] DOB: 01/14/1956 MRN: [id number]   Date of Service  01/14/2015  HPI/Events of Note  Called by pharmacy with multiple issues: 1. Fever to 103.7 this AM. Patient on cooling blanket. Patient NPO and can't get Motrin PO. Patient with concern for ETOH/Liver Disease and can't get Tylenol PR. 2. Blood glucose = 352. Patient already on Lantus and moderate Novolog SSI.  eICU Interventions  Will order: 1. Increase Lantus to 15 units Q PM. 2. Will continue cooling blanket.         Tony French 01/14/2015, 4:35 PM

## 2015-01-14 NOTE — Progress Notes (Signed)
eLink Physician-Brief Progress Note Patient Name: Demario Faniel. Romer DOB: 03-24-1956 MRN: [id number]   Date of Service  01/14/2015  HPI/Events of Note  Ongoing issues of agitation and hypertension.  Bedside nurse feels the hypertension is related to the agitation.  On max precedex.  Has PRN ativan ordered.  Did not respond to 10 mg of hydralazine.  eICU Interventions  Plan: 1. Give ativan as ordered on MAR 2. PRN hydralazine changed to q2 hours prn 3. Added labetalol 10 mg IV q2 hours prn     Intervention Category Major Interventions: Delirium, psychosis, severe agitation - evaluation and management;Hypertension - evaluation and management  DETERDING,ELIZABETH 01/14/2015, 11:48 PM

## 2015-01-14 NOTE — Progress Notes (Addendum)
Subjective: Patient off Fentanyl and extubated 30 minutes prior to evaluation.  Remains agitated and confused, not following commands. Wife noted over the past few days prior to arrival as far as she knew he was taking his insulin. He was noted to be sick and experiencing nausea and vomiting days prior to hospitalization.   Objective: Current vital signs: BP 123/107 mmHg  Pulse 145  Temp(Src) 98.4 F (36.9 C) (Core (Comment))  Resp 12  Ht 5\' 8"  (1.727 m)  Wt 90.1 kg (198 lb 10.2 oz)  BMI 30.21 kg/m2  SpO2 94% Vital signs in last 24 hours: Temp:  [98.4 F (36.9 C)-102.4 F (39.1 C)] 98.4 F (36.9 C) (08/18 0800) Pulse Rate:  [83-145] 145 (08/18 0900) Resp:  [9-29] 12 (08/18 0900) BP: (79-138)/(43-107) 123/107 mmHg (08/18 0900) SpO2:  [94 %-100 %] 94 % (08/18 0900) Arterial Line BP: (83-173)/(38-83) 167/80 mmHg (08/18 0900) FiO2 (%):  [40 %] 40 % (08/18 0825) Weight:  [90.1 kg (198 lb 10.2 oz)] 90.1 kg (198 lb 10.2 oz) (08/18 0500)  Intake/Output from previous day: 08/17 0701 - 08/18 0700 In: 4502.6 [I.V.:3152.6; NG/GT:300; IV Piggyback:1050] Out: 385 [Urine:385] Intake/Output this shift: Total I/O In: 530 [I.V.:471.6; NG/GT:58.3] Out: 20 [Urine:20] Nutritional status: Diet NPO time specified  Neurologic Exam: General: Mental Status: Restless, follows no commands, no blink to threat and no verbalization Cranial Nerves: II: no blink to threat, pupils equal, round, reactive to light and accommodation III,IV, VI: ptosis not present, extra-ocular motions intact bilaterally V,VII: face symmetric VIII: opens eyes when name called IX,X: unable to assess XII: unable to assess  Motor: Moving all extremities spontaneous and to pain with 5/5 strength Sensory: withdraws from pain in all extremities Deep Tendon Reflexes:  1+ and symmetric throughout in UE and 1+ bilateral LE and no AJ Plantars: Right: upgoingLeft: downgoing   Lab Results: Basic  Metabolic Panel:  Recent Labs Lab 01/13/15 0050 01/13/15 0519 01/13/15 1240 01/13/15 2303 01/14/15 0248  NA 146* 143  --   --  144  K 3.5 3.2*  --   --  4.5  CL 111 112*  --   --  117*  CO2 21* 21*  --   --  22  GLUCOSE 311* 246*  --   --  332*  BUN 8 10  --   --  28*  CREATININE 0.99 1.43*  --   --  1.09  CALCIUM 8.7* 8.2*  --   --  7.9*  MG 1.7 1.7 1.6* 1.6*  --   PHOS 2.4* 2.3* 2.8 2.5  --     Liver Function Tests:  Recent Labs Lab 01/13/15 0050 01/14/15 0248  AST 20 12*  ALT 16* 11*  ALKPHOS 56 43  BILITOT 1.5* 0.5  PROT 5.9* 5.0*  ALBUMIN 3.3* 2.6*   No results for input(s): LIPASE, AMYLASE in the last 168 hours.  Recent Labs Lab 01/13/15 0054  AMMONIA 35    CBC:  Recent Labs Lab 01/13/15 0050 01/13/15 0519 01/14/15 0248  WBC 10.1 8.8 9.5  NEUTROABS 7.0  --  7.2  HGB 15.4 14.3 11.6*  HCT 42.5 39.6 34.1*  MCV 86.0 85.9 88.8  PLT 193 200 130*    Cardiac Enzymes:  Recent Labs Lab 01/13/15 0050 01/13/15 0519 01/13/15 1230  TROPONINI 0.41* 0.54* 0.40*    Lipid Panel:  Recent Labs Lab 01/14/15 0513  TRIG 126    CBG:  Recent Labs Lab 01/13/15 1959 01/14/15 0025 01/14/15 0524 01/14/15 0539 01/14/15  0739  GLUCAP 253* 292* 291* 285* 266*    Microbiology: Results for orders placed or performed during the hospital encounter of 01/22/2015  MRSA PCR Screening     Status: None   Collection Time: 01/13/15 12:05 AM  Result Value Ref Range Status   MRSA by PCR NEGATIVE NEGATIVE Final    Comment:        The GeneXpert MRSA Assay (FDA approved for NASAL specimens only), is one component of a comprehensive MRSA colonization surveillance program. It is not intended to diagnose MRSA infection nor to guide or monitor treatment for MRSA infections.   Culture, blood (routine x 2)     Status: None (Preliminary result)   Collection Time: 01/13/15  1:12 AM  Result Value Ref Range Status   Specimen Description BLOOD LEFT ARM  Final    Special Requests   Final    BOTTLES DRAWN AEROBIC AND ANAEROBIC 10CC BLUE 7CC RED   Culture  Setup Time   Final    GRAM POSITIVE COCCI IN CLUSTERS IN BOTH AEROBIC AND ANAEROBIC BOTTLES CRITICAL RESULT CALLED TO, READ BACK BY AND VERIFIED WITH: Shirlee Limerick RN [id number] 01/13/15 A BROWNING    Culture PENDING  Incomplete   Report Status PENDING  Incomplete  CSF culture with Stat gram stain     Status: None (Preliminary result)   Collection Time: 01/13/15 11:58 AM  Result Value Ref Range Status   Specimen Description CSF  Final   Special Requests NO2 2CC  Final   Gram Stain   Final    CYTOSPIN SMEAR WBC PRESENT, PREDOMINANTLY MONONUCLEAR NO ORGANISMS SEEN    Culture PENDING  Incomplete   Report Status PENDING  Incomplete    Coagulation Studies: No results for input(s): LABPROT, INR in the last 72 hours.  Imaging: Ct Head Wo Contrast  01/13/2015   CLINICAL DATA:  Altered mental status  EXAM: CT HEAD WITHOUT CONTRAST  TECHNIQUE: Contiguous axial images were obtained from the base of the skull through the vertex without intravenous contrast.  COMPARISON:  None.  FINDINGS: Skull and Sinuses: Nasopharyngeal and small bilateral mastoid fluid in the setting of intubation. No sinusitis.  Orbits: No acute abnormality.  Brain: No evidence of acute infarction, hemorrhage, hydrocephalus, or mass lesion/mass effect. Intracranial atherosclerosis seen in the carotids and right vertebral artery.  IMPRESSION: 1. No acute finding. 2. Intracranial atherosclerosis.   Electronically Signed   By: Marnee Spring M.D.   On: 01/13/2015 06:33   Mr Brain Wo Contrast  01/14/2015   CLINICAL DATA:  Initial evaluation for acute altered mental status. Patient currently admitted with DKA. Febrile.  EXAM: MRI HEAD WITHOUT CONTRAST  TECHNIQUE: Multiplanar, multiecho pulse sequences of the brain and surrounding structures were obtained without intravenous contrast.  COMPARISON:  Prior CT from 01/13/2015.  FINDINGS: Mild diffuse  prominence of the CSF containing spaces is compatible with generalized cerebral atrophy. Minimal T2/FLAIR hyperintensity within the periventricular white matter likely related to chronic small vessel ischemic disease, very minimal for patient age.  Diffusion-weighted sequence limited by susceptibility artifact at the right parietotemporal scalp. No abnormal foci of restricted diffusion to suggest acute intracranial infarct. Gray-white matter differentiation maintained. Normal intravascular flow voids are preserved. No acute intracranial hemorrhage. Single small chronic micro hemorrhage present within the right basal ganglia, likely related to a tiny remote lacunar infarct.  No mass lesion, midline shift, or mass effect. No parenchymal changes to suggest acute encephalitis. No hydrocephalus. No extra-axial fluid collection.  Craniocervical junction within normal  limits. Pituitary gland normal. No acute abnormality about the orbits.  Mild mucosal thickening throughout the paranasal sinuses. Bilateral mastoid effusions noted. Inner ear structures within normal limits.  Bone marrow signal intensity within normal limits. Scalp soft tissues unremarkable.  IMPRESSION: 1. No acute intracranial process identified. No findings to suggest acute encephalitis. 2. Mild generalized age-related cerebral atrophy.   Electronically Signed   By: Rise Mu M.D.   On: 01/14/2015 05:42   Dg Chest Port 1 View  01/14/2015   CLINICAL DATA:  Septic shock. History of diabetes and coronary artery disease.  EXAM: PORTABLE CHEST - 1 VIEW  COMPARISON:  Portable chest x-ray of January 13, 2015  FINDINGS: The lungs are hypoinflated. The endotracheal tube tip lies 3.6 cm above the carina. There is subsegmental atelectasis at the lung bases. There is no pleural effusion or pneumothorax. The cardiac silhouette is largely obscured. The central pulmonary vascularity exhibits crowding. The esophagogastric tube tip projects below the inferior  margin of the image. The right internal jugular venous catheter tip projects over the midportion of the SVC.  IMPRESSION: Bilateral hypo nflation with bibasilar atelectasis greater on the left than on the right. No definite CHF. The support tubes are in reasonable position.   Electronically Signed   By: David  [place] M.D.   On: 01/14/2015 07:43   Dg Chest Port 1 View  01/13/2015   CLINICAL DATA:  Sepsis.  Central line placement  EXAM: PORTABLE CHEST - 1 VIEW  COMPARISON:  01/13/2015  FINDINGS: Endotracheal tube remains in stable position. NG tube has been repositioned and advanced into the stomach. Right internal jugular central line is in place with the tip in the SVC. No pneumothorax. Decreasing lung volumes on the left with increasing left base atelectasis. Minimal right base atelectasis. Heart is normal size. No effusions.  IMPRESSION: Right central line tip in the SVC. No pneumothorax. NG tube now seen entering the stomach.  Bibasilar atelectasis, increased on the left.   Electronically Signed   By: Charlett Nose M.D.   On: 01/13/2015 12:10   Dg Chest Port 1 View  01/13/2015   CLINICAL DATA:  Endotracheal tube placement.  Initial encounter.  EXAM: PORTABLE CHEST - 1 VIEW  COMPARISON:  Chest radiograph performed 01/09/2015  FINDINGS: The patient's endotracheal tube is seen ending 3-4 cm above the carina. The patient's enteric tube appears to be coiled overlying the hypopharynx, ending overlying the proximal esophagus.  Mild bibasilar atelectasis is noted. No pleural effusion or pneumothorax is seen.  The cardiomediastinal silhouette is normal in size. No acute osseous abnormalities are identified.  IMPRESSION: 1. Endotracheal tube seen ending 3-4 cm above the carina. 2. Enteric tube appears to be coiled overlying the hypopharynx, ending overlying the proximal esophagus. This should be retracted and repositioned as deemed clinically appropriate. 3. Mild bibasilar atelectasis noted.  Lungs otherwise grossly  clear. These results were called by telephone at the time of interpretation on 01/13/2015 at 1:57 am to Nursing on [id number], who verbally acknowledged these results.   Electronically Signed   By: Roanna Raider M.D.   On: 01/13/2015 01:58   Dg Abd Portable 1v  01/13/2015   CLINICAL DATA:  Orogastric tube placement.  Initial encounter.  EXAM: PORTABLE ABDOMEN - 1 VIEW  COMPARISON:  None.  FINDINGS: The patient's enteric tube is seen ending overlying the body of the stomach.  The visualized bowel gas pattern is unremarkable. Scattered air and stool filled loops of colon are seen; no abnormal dilatation  of small bowel loops is seen to suggest small bowel obstruction. No free intra-abdominal air is identified, though evaluation for free air is limited on a single supine view.  The visualized osseous structures are within normal limits; the sacroiliac joints are unremarkable in appearance.  IMPRESSION: Enteric tube seen ending overlying the body of the stomach.   Electronically Signed   By: Roanna Raider M.D.   On: 01/13/2015 02:19   US Abdomen Limited Ruq  01/13/2015   CLINICAL DATA:  Nausea.  EXAM: US ABDOMEN LIMITED - RIGHT UPPER QUADRANT  COMPARISON:  Ultrasound 03/26/2011 .  FINDINGS: Gallbladder:  No gallstones or wall thickening visualized. No sonographic Murphy sign noted.  Common bile duct:  Diameter: 3.1 mm.  Liver:  No focal lesion identified. Within normal limits in parenchymal echogenicity.  IMPRESSION: Negative exam.   Electronically Signed   By: Maisie Fus  Register   On: 01/13/2015 10:29    Medications:  Scheduled: . antiseptic oral rinse  7 mL Mouth Rinse QID  . aspirin  81 mg Per Tube Daily  . chlorhexidine gluconate  15 mL Mouth Rinse BID  . famotidine (PEPCID) IV  20 mg Intravenous Q12H  . folic acid  1 mg Intravenous Daily  . heparin  5,000 Units Subcutaneous 3 times per day  . hydrocortisone sodium succinate  50 mg Intravenous Q6H  . imipenem-cilastatin  500 mg Intravenous 4 times per  day  . insulin aspart  0-15 Units Subcutaneous 6 times per day  . insulin glargine  10 Units Subcutaneous QHS  . thiamine  100 mg Intravenous Daily  . vancomycin  750 mg Intravenous Q12H   Antwann Preziosi PA-C [facility] [phone]  01/14/2015, 9:37 AM  Patient seen and examined.  Clinical course and management discussed.  Necessary edits performed.  I agree with the above.  Assessment and plan of care developed and discussed below.     Assessment/Plan: 59 YO male with altered mental status after presenting to ED in DKA. Initially febrile but today is afebrile.  BC grew  GPC in clusters and currently on Vancomycin and Imipenem-cilastatin. Today he was extubated and taken off Fentanyl.  Patient remains altered. EEG shows no epileptiform activity with generalized slowing.   MRI of the brain independently reviewed and shows no acute changes. Etiology likely encephalopathy related to infection.  Recommend: 1) Agree with treating underlying infection.  2) Will continue to follow with you   Thana Farr, MD [facility] [phone]  01/14/2015  10:59 AM

## 2015-01-14 NOTE — Procedures (Signed)
Extubation Procedure Note MD placed patient on 5 over 5, found pt. On SBT.VT greater than 1L, MD request extubation now.  Patient Details:   Name: Tony French DOB: 05/11/1956 MRN: [id number]   Airway Documentation:   Pt. Had air leaking around cuff prior to extubation.   Evaluation  O2 sats: stable throughout Complications: No apparent complications Patient did tolerate procedure well. Bilateral Breath Sounds: Clear, Diminished Suctioning: Airway No pt not following commands. RN and MD aware. No stridor noted.  [place] R Kelton Bultman 01/14/2015, 8:44 AM

## 2015-01-15 DIAGNOSIS — R4182 Altered mental status, unspecified: Secondary | ICD-10-CM

## 2015-01-15 LAB — CULTURE, BLOOD (ROUTINE X 2)

## 2015-01-15 LAB — GLUCOSE, CAPILLARY
GLUCOSE-CAPILLARY: 246 mg/dL — AB (ref 65–99)
GLUCOSE-CAPILLARY: 253 mg/dL — AB (ref 65–99)
GLUCOSE-CAPILLARY: 262 mg/dL — AB (ref 65–99)
Glucose-Capillary: 232 mg/dL — ABNORMAL HIGH (ref 65–99)
Glucose-Capillary: 276 mg/dL — ABNORMAL HIGH (ref 65–99)
Glucose-Capillary: 265 mg/dL — ABNORMAL HIGH (ref 65–99)
Glucose-Capillary: 202 mg/dL — ABNORMAL HIGH (ref 65–99)

## 2015-01-15 LAB — COMPREHENSIVE METABOLIC PANEL
ALT: 12 U/L — ABNORMAL LOW (ref 17–63)
ANION GAP: 12 (ref 5–15)
AST: 14 U/L — ABNORMAL LOW (ref 15–41)
Albumin: 2.6 g/dL — ABNORMAL LOW (ref 3.5–5.0)
Alkaline Phosphatase: 46 U/L (ref 38–126)
BUN: 13 mg/dL (ref 6–20)
CHLORIDE: 109 mmol/L (ref 101–111)
CO2: 21 mmol/L — AB (ref 22–32)
Calcium: 8 mg/dL — ABNORMAL LOW (ref 8.9–10.3)
Creatinine, Ser: 0.88 mg/dL (ref 0.61–1.24)
GFR calc non Af Amer: >60 mL/min (ref >60)
Glucose, Bld: 277 mg/dL — ABNORMAL HIGH (ref 65–99)
Potassium: 3.7 mmol/L (ref 3.5–5.1)
SODIUM: 142 mmol/L (ref 135–145)
Total Bilirubin: 0.8 mg/dL (ref 0.3–1.2)
Total Protein: 5.1 g/dL — ABNORMAL LOW (ref 6.5–8.1)

## 2015-01-15 LAB — CBC
HCT: 33.4 % — ABNORMAL LOW (ref 39.0–52.0)
HEMOGLOBIN: 11.6 g/dL — AB (ref 13.0–17.0)
MCH: 30.4 pg (ref 26.0–34.0)
MCHC: 34.7 g/dL (ref 30.0–36.0)
MCV: 87.4 fL (ref 78.0–100.0)
Platelets: 124 10*3/uL — ABNORMAL LOW (ref 150–400)
RBC: 3.82 MIL/uL — AB (ref 4.22–5.81)
RDW: 13.2 % (ref 11.5–15.5)
WBC: 7 10*3/uL (ref 4.0–10.5)

## 2015-01-15 LAB — MAGNESIUM: MAGNESIUM: 1.6 mg/dL — AB (ref 1.7–2.4)

## 2015-01-15 MED ORDER — WHITE PETROLATUM GEL
Status: AC
  Administered 2015-01-15: 0.2
  Filled 2015-01-15: qty 1

## 2015-01-15 MED ORDER — HYDROCORTISONE NA SUCCINATE PF 100 MG IJ SOLR
50.0000 mg | Freq: Two times a day (BID) | INTRAMUSCULAR | Status: DC
  Administered 2015-01-15 – 2015-01-16 (×2): 50 mg via INTRAVENOUS
  Filled 2015-01-15 (×2): qty 1

## 2015-01-15 MED ORDER — LORAZEPAM 2 MG/ML IJ SOLN
0.5000 mg | INTRAMUSCULAR | Status: DC | PRN
  Administered 2015-01-15 – 2015-01-16 (×2): 1 mg via INTRAVENOUS
  Filled 2015-01-15 (×2): qty 1

## 2015-01-15 MED ORDER — INSULIN GLARGINE 100 UNIT/ML ~~LOC~~ SOLN
30.0000 [IU] | Freq: Two times a day (BID) | SUBCUTANEOUS | Status: DC
  Administered 2015-01-15 – 2015-01-16 (×3): 30 [IU] via SUBCUTANEOUS
  Filled 2015-01-15 (×5): qty 0.3

## 2015-01-15 MED ORDER — SODIUM CHLORIDE 0.9 % IV SOLN: INTRAVENOUS | Status: DC

## 2015-01-15 NOTE — Progress Notes (Signed)
Wasted 25 mls of Fentanyl IV drip witnessed by Letitia Libra RN.

## 2015-01-15 NOTE — Progress Notes (Signed)
PULMONARY / CRITICAL CARE MEDICINE   Name: Tony French MRN: [id number] DOB: 11-16-1955    ADMISSION DATE:  01/23/2015  REFERRING MD :  [facility]  CHIEF COMPLAINT:  AMS  INITIAL PRESENTATION:   59 yo male presented to [facility] with DKA.  Transferred to [facility] on 8/16.  Intubated for airway protection in setting of encephalopathy.  STUDIES:  8/17 EEG >> general slowing 8/17 CT head >> no acute finding 8/17 Abd u/s >> negative exam 8/17 LP >> WBC 1, RBC 225, glucose 167, protein 54 8/18 MRI brain >> no acute process  SIGNIFICANT EVENTS: 8/13 - admitted to [facility] with DKA. 8/16 - transferred to [facility] ICU.  Intubated for airway protection. 8/18 - extubated; started on precedex.  8/19 more awake. Progressing   SUBJECTIVE:  No distress.   VITAL SIGNS: Temp:  [98 F (36.7 C)-99.5 F (37.5 C)] 99.5 F (37.5 C) (08/19 1215) Pulse Rate:  [85-99] 85 (08/19 0700) Resp:  [11-26] 16 (08/19 0700) BP: (99-200)/(70-88) 148/83 mmHg (08/19 0700) SpO2:  [94 %-100 %] 97 % (08/19 0700) Arterial Line BP: (101-194)/(57-90) 184/76 mmHg (08/19 0300) HEMODYNAMICS:   VENTILATOR SETTINGS:   INTAKE / OUTPUT: Intake/Output      08/18 0701 - 08/19 0700 08/19 0701 - 08/20 0700   I.V. (mL/kg) 2446 (27.1) 67.5 (0.7)   NG/GT 58.3    IV Piggyback 900 150   Total Intake(mL/kg) 3404.3 (37.8) 217.5 (2.4)   Urine (mL/kg/hr) 1995 (0.9) 875 (1.7)   Total Output 1995 875   Net +1409.3 -657.5          PHYSICAL EXAMINATION: General: resting in bed  Neuro: arouses to voice, moves all ext. Sp slurred. Agitated at times. Still on precedex.  HEENT: NCAT Cardiovascular: regular, no murmur Lungs: basilar crackles, no accessory muscle use  Abdomen: soft, non tender Musculoskeletal: no edema  Skin: no rashes  LABS:  CBC  Recent Labs Lab 01/13/15 0519 01/14/15 0248 01/15/15 0427  WBC 8.8 9.5 7.0  HGB 14.3 11.6* 11.6*  HCT 39.6 34.1* 33.4*  PLT 200 130* 124*   BMET  Recent Labs Lab  01/13/15 0519 01/14/15 0248 01/15/15 0427  NA 143 144 142  K 3.2* 4.5 3.7  CL 112* 117* 109  CO2 21* 22 21*  BUN 10 28* 13  CREATININE 1.43* 1.09 0.88  GLUCOSE 246* 332* 277*   Electrolytes  Recent Labs Lab 01/13/15 0519 01/13/15 1240 01/13/15 2303 01/14/15 0248 01/15/15 0427  CALCIUM 8.2*  --   --  7.9* 8.0*  MG 1.7 1.6* 1.6*  --  1.6*  PHOS 2.3* 2.8 2.5  --   --    Sepsis Markers  Recent Labs Lab 01/13/15 0053 01/13/15 0111 01/13/15 0142 01/13/15 1240 01/14/15 0248  LATICACIDVEN 1.1  --  0.64  --   --   PROCALCITON  --  <0.10  --  <0.10 <0.10   ABG  Recent Labs Lab 01/13/15 0005 01/14/15 0250  PHART 7.433 7.330*  PCO2ART 32.3* 44.2  PO2ART 98.7 109.0*   Liver Enzymes  Recent Labs Lab 01/13/15 0050 01/14/15 0248 01/15/15 0427  AST 20 12* 14*  ALT 16* 11* 12*  ALKPHOS 56 43 46  BILITOT 1.5* 0.5 0.8  ALBUMIN 3.3* 2.6* 2.6*   Cardiac Enzymes  Recent Labs Lab 01/13/15 0050 01/13/15 0519 01/13/15 1230  TROPONINI 0.41* 0.54* 0.40*   Glucose  Recent Labs Lab 01/14/15 1527 01/14/15 1933 01/15/15 0004 01/15/15 0337 01/15/15 0739 01/15/15 1136  GLUCAP 236* 264*  262* 232* 276* 265*    Imaging No results found.   ASSESSMENT / PLAN:  PULMONARY ETT 8/17 > 8/18 A: Compromised airway in setting of acute encephalopathy. P:   Extubated 8/18 Monitor oxygenation Bronchial hygiene F/u CXR intermittently  CARDIOVASCULAR A:  Septic shock-->resolved  Demand ischemia. Hx of HTN. HLD. P:  Monitor hemodynamics Hold outpatient lisinopril, zetia  RENAL A:   Mild nonAG metabolic acidosis-->resolved  Hypokalemia. P:   Cont  D5 1/2 NS with 20 KCL at 75 ml/hr  GASTROINTESTINAL A:   Nutrition. P:   NPO-->clears when awake  Pepcid for SUP  HEMATOLOGIC A:   Thrombocytopenia. P:  F/u CBC SQ heparin for DVT prevention  INFECTIOUS A:   Sepsis with CON SA in blood-->likely contaminate  P:   Day 4 Abx, currently on  primaxin/vancomycin  Blood 8/17 >> CSF 8/17 >>neg  CSF HSV 8/17 >>neg  Blood 8/19 >>  ENDOCRINE A:   DM with admission for DKA at [facility]. Relative adrenal insufficiency >> cortisol 10 from 8/17. P:   SSI with lantus Continue solu cortef, taper to off  NEUROLOGIC A:   Acute metabolic encephalopathy. Felt d/t acute infectious etiology  Reported hx of ETOH >> wife adamantly denies this. P:   Neurology signed off 8/19 PRN fentanyl PRN ativan for CIWA > 8 Continue thiamine, folic acid     Updated pt's wife at bedside >> she denies that he has hx of significant alcohol use.  States he only has social alcohol use. Slowly improving. Will cont supportive care. No evidence of irreversible neurological injury. If f/u cultures negative we will dc abx  [name] ACNP-BC [facility] Pulmonary/Critical Care Pager # [phone] OR # [phone] if no answer  He is slowly waking up.  Following commands better and able to speak.  Does not remember what happened to him.  Alert, moves extremities, pupils reactive, lungs w/o wheeze, abd soft, no edema.  Continue Abx >> Coag neg staph in blood likely contaminate; will repeat blood cx.  Wean off precedex as tolerated.  Defer diet until more alert.  Wife update at bedside.  Coralyn Helling, MD [facility] Pulmonary/Critical Care 01/15/2015, 1:44 PM Pager:  [phone] After 3pm call: [phone]

## 2015-01-15 NOTE — Progress Notes (Signed)
PT Cancellation Note  Patient Details Name: Tony French. Tejera MRN: [id number] DOB: 11-23-1955   Cancelled Treatment:    Reason Eval/Treat Not Completed: Patient's level of consciousness. Noted precedex recently increased. Spoke with RN and pt has "just settled down" and requested PT defer evaluation at this time. Will reattempt 8/20 as appropriate.   Moyses Pavey 01/15/2015, 3:18 PM  Pager [phone]

## 2015-01-15 NOTE — Progress Notes (Signed)
Subjective: Patient extubated, calm, when spoken to tries to speak by mouthing words. Now able to follow simple commands.  Objective: Current vital signs: BP 148/83 mmHg  Pulse 85  Temp(Src) 98 F (36.7 C) (Core (Comment))  Resp 16  Ht  (1.727 m)  Wt 90.1 kg (198 lb 10.2 oz)  BMI 30.21 kg/m2  SpO2 97% Vital signs in last 24 hours: Temp:  [98 F (36.7 C)-101.7 F (38.7 C)] 98 F (36.7 C) (08/19 0000) Pulse Rate:  [85-145] 85 (08/19 0700) Resp:  [11-26] 16 (08/19 0700) BP: (99-200)/(62-107) 148/83 mmHg (08/19 0700) SpO2:  [94 %-100 %] 97 % (08/19 0700) Arterial Line BP: (101-194)/(57-90) 184/76 mmHg (08/19 0300)  Intake/Output from previous day: 08/18 0701 - 08/19 0700 In: 3404.3 [I.V.:2446; NG/GT:58.3; IV Piggyback:900] Out: 1995 [Urine:1995] Intake/Output this shift:   Nutritional status: Diet NPO time specified  Neurologic Exam: General: Mental Status: Alert, attempts to speak by mouthing words, tracks movements in room and follows one step simple commands.  Cranial Nerves: II: blinks to threat bilaterally, pupils equal, round, reactive to light and accommodation III,IV, VI: ptosis not present, extra-ocular motions intact bilaterally V,VII: smile symmetric, facial light touch sensation normal bilaterally VIII: hearing normal bilaterally IX,X: unable to visualize XI: bilateral shoulder shrug XII: midline tongue extension  Motor: Moving all extremities antigravity to commands.   Sensory: withdraws to pain in all extremities Deep Tendon Reflexes:  Right: Upper Extremity   Left: Upper extremity   biceps (C-5 to C-6) 2/4   biceps (C-5 to C-6) 2/4 tricep (C7) 2/4    triceps (C7) 2/4 Brachioradialis (C6) 2/4  Brachioradialis (C6) 2/4  Lower Extremity Lower Extremity  quadriceps (L-2 to L-4) 2/4   quadriceps (L-2 to L-4) 2/4 Achilles (S1) 2/4   Achilles (S1) 2/4  Plantars: Right: downgoing   Left: downgoing    Lab Results: Basic Metabolic  Panel:  Recent Labs Lab 01/13/15 0050 01/13/15 0519 01/13/15 1240 01/13/15 2303 01/14/15 0248 01/15/15 0427  NA 146* 143  --   --  144 142  K 3.5 3.2*  --   --  4.5 3.7  CL 111 112*  --   --  117* 109  CO2 21* 21*  --   --  22 21*  GLUCOSE 311* 246*  --   --  332* 277*  BUN 8 10  --   --  28* 13  CREATININE 0.99 1.43*  --   --  1.09 0.88  CALCIUM 8.7* 8.2*  --   --  7.9* 8.0*  MG 1.7 1.7 1.6* 1.6*  --  1.6*  PHOS 2.4* 2.3* 2.8 2.5  --   --     Liver Function Tests:  Recent Labs Lab 01/13/15 0050 01/14/15 0248 01/15/15 0427  AST 20 12* 14*  ALT 16* 11* 12*  ALKPHOS 56 43 46  BILITOT 1.5* 0.5 0.8  PROT 5.9* 5.0* 5.1*  ALBUMIN 3.3* 2.6* 2.6*   No results for input(s): LIPASE, AMYLASE in the last 168 hours.  Recent Labs Lab 01/13/15 0054  AMMONIA 35    CBC:  Recent Labs Lab 01/13/15 0050 01/13/15 0519 01/14/15 0248 01/15/15 0427  WBC 10.1 8.8 9.5 7.0  NEUTROABS 7.0  --  7.2  --   HGB 15.4 14.3 11.6* 11.6*  HCT 42.5 39.6 34.1* 33.4*  MCV 86.0 85.9 88.8 87.4  PLT 193 200 130* 124*    Cardiac Enzymes:  Recent Labs Lab 01/13/15 0050 01/13/15 0519 01/13/15 1230  TROPONINI 0.41*  0.54* 0.40*    Lipid Panel:  Recent Labs Lab 01/14/15 0513  TRIG 126    CBG:  Recent Labs Lab 01/14/15 1527 01/14/15 1933 01/15/15 0004 01/15/15 0337 01/15/15 0739  GLUCAP 236* 264* 262* 232* 276*    Microbiology: Results for orders placed or performed during the hospital encounter of 01/20/2015  MRSA PCR Screening     Status: None   Collection Time: 01/13/15 12:05 AM  Result Value Ref Range Status   MRSA by PCR NEGATIVE NEGATIVE Final    Comment:        The GeneXpert MRSA Assay (FDA approved for NASAL specimens only), is one component of a comprehensive MRSA colonization surveillance program. It is not intended to diagnose MRSA infection nor to guide or monitor treatment for MRSA infections.   Culture, blood (routine x 2)     Status: None  (Preliminary result)   Collection Time: 01/13/15  1:03 AM  Result Value Ref Range Status   Specimen Description BLOOD LEFT HAND  Final   Special Requests BOTTLES DRAWN AEROBIC ONLY 5CC  Final   Culture NO GROWTH 1 DAY  Final   Report Status PENDING  Incomplete  Culture, blood (routine x 2)     Status: None (Preliminary result)   Collection Time: 01/13/15  1:12 AM  Result Value Ref Range Status   Specimen Description BLOOD LEFT ARM  Final   Special Requests   Final    BOTTLES DRAWN AEROBIC AND ANAEROBIC 10CC BLUE 7CC RED   Culture  Setup Time   Final    GRAM POSITIVE COCCI IN CLUSTERS IN BOTH AEROBIC AND ANAEROBIC BOTTLES CRITICAL RESULT CALLED TO, READ BACK BY AND VERIFIED WITH: Shirlee Limerick RN 1948 01/13/15 A BROWNING    Culture   Final    STAPHYLOCOCCUS SPECIES (COAGULASE NEGATIVE) THE SIGNIFICANCE OF ISOLATING THIS ORGANISM FROM A SINGLE SET OF BLOOD CULTURES WHEN MULTIPLE SETS ARE DRAWN IS UNCERTAIN. PLEASE NOTIFY THE MICROBIOLOGY DEPARTMENT WITHIN ONE WEEK IF SPECIATION AND SENSITIVITIES ARE REQUIRED.    Report Status PENDING  Incomplete  CSF culture with Stat gram stain     Status: None (Preliminary result)   Collection Time: 01/13/15 11:58 AM  Result Value Ref Range Status   Specimen Description CSF  Final   Special Requests NO2 2CC  Final   Gram Stain   Final    CYTOSPIN SMEAR WBC PRESENT, PREDOMINANTLY MONONUCLEAR NO ORGANISMS SEEN    Culture NO GROWTH 1 DAY  Final   Report Status PENDING  Incomplete    Coagulation Studies: No results for input(s): LABPROT, INR in the last 72 hours.  Imaging: Mr Sherrin Daisy Contrast  01/14/2015   CLINICAL DATA:  Initial evaluation for acute altered mental status. Patient currently admitted with DKA. Febrile.  EXAM: MRI HEAD WITHOUT CONTRAST  TECHNIQUE: Multiplanar, multiecho pulse sequences of the brain and surrounding structures were obtained without intravenous contrast.  COMPARISON:  Prior CT from 01/13/2015.  FINDINGS: Mild diffuse  prominence of the CSF containing spaces is compatible with generalized cerebral atrophy. Minimal T2/FLAIR hyperintensity within the periventricular white matter likely related to chronic small vessel ischemic disease, very minimal for patient age.  Diffusion-weighted sequence limited by susceptibility artifact at the right parietotemporal scalp. No abnormal foci of restricted diffusion to suggest acute intracranial infarct. Gray-white matter differentiation maintained. Normal intravascular flow voids are preserved. No acute intracranial hemorrhage. Single small chronic micro hemorrhage present within the right basal ganglia, likely related to a tiny remote lacunar infarct.  No mass lesion, midline shift, or mass effect. No parenchymal changes to suggest acute encephalitis. No hydrocephalus. No extra-axial fluid collection.  Craniocervical junction within normal limits. Pituitary gland normal. No acute abnormality about the orbits.  Mild mucosal thickening throughout the paranasal sinuses. Bilateral mastoid effusions noted. Inner ear structures within normal limits.  Bone marrow signal intensity within normal limits. Scalp soft tissues unremarkable.  IMPRESSION: 1. No acute intracranial process identified. No findings to suggest acute encephalitis. 2. Mild generalized age-related cerebral atrophy.   Electronically Signed   By: Rise Mu M.D.   On: 01/14/2015 05:42   Dg Chest Port 1 View  01/14/2015   CLINICAL DATA:  Septic shock. History of diabetes and coronary artery disease.  EXAM: PORTABLE CHEST - 1 VIEW  COMPARISON:  Portable chest x-ray of January 13, 2015  FINDINGS: The lungs are hypoinflated. The endotracheal tube tip lies 3.6 cm above the carina. There is subsegmental atelectasis at the lung bases. There is no pleural effusion or pneumothorax. The cardiac silhouette is largely obscured. The central pulmonary vascularity exhibits crowding. The esophagogastric tube tip projects below the inferior  margin of the image. The right internal jugular venous catheter tip projects over the midportion of the SVC.  IMPRESSION: Bilateral hypo nflation with bibasilar atelectasis greater on the left than on the right. No definite CHF. The support tubes are in reasonable position.   Electronically Signed   By: David  [place] M.D.   On: 01/14/2015 07:43   Dg Chest Port 1 View  01/13/2015   CLINICAL DATA:  Sepsis.  Central line placement  EXAM: PORTABLE CHEST - 1 VIEW  COMPARISON:  01/13/2015  FINDINGS: Endotracheal tube remains in stable position. NG tube has been repositioned and advanced into the stomach. Right internal jugular central line is in place with the tip in the SVC. No pneumothorax. Decreasing lung volumes on the left with increasing left base atelectasis. Minimal right base atelectasis. Heart is normal size. No effusions.  IMPRESSION: Right central line tip in the SVC. No pneumothorax. NG tube now seen entering the stomach.  Bibasilar atelectasis, increased on the left.   Electronically Signed   By: Charlett Nose M.D.   On: 01/13/2015 12:10   US Abdomen Limited Ruq  01/13/2015   CLINICAL DATA:  Nausea.  EXAM: US ABDOMEN LIMITED - RIGHT UPPER QUADRANT  COMPARISON:  Ultrasound 03/26/2011 .  FINDINGS: Gallbladder:  No gallstones or wall thickening visualized. No sonographic Murphy sign noted.  Common bile duct:  Diameter: 3.1 mm.  Liver:  No focal lesion identified. Within normal limits in parenchymal echogenicity.  IMPRESSION: Negative exam.   Electronically Signed   By: Maisie Fus  Register   On: 01/13/2015 10:29    Medications:  Scheduled: . antiseptic oral rinse  7 mL Mouth Rinse QID  . aspirin  81 mg Per Tube Daily  . chlorhexidine gluconate  15 mL Mouth Rinse BID  . famotidine (PEPCID) IV  20 mg Intravenous Q12H  . folic acid  1 mg Intravenous Daily  . heparin  5,000 Units Subcutaneous 3 times per day  . hydrocortisone sodium succinate  50 mg Intravenous Q6H  . imipenem-cilastatin  500 mg  Intravenous 4 times per day  . insulin aspart  0-15 Units Subcutaneous 6 times per day  . insulin glargine  15 Units Subcutaneous QHS  . thiamine  100 mg Intravenous Daily  . vancomycin  1,000 mg Intravenous Q8H   Jaeden Messer PA-C [facility] [phone]  01/15/2015, 8:39 AM  Patient seen and examined.  Clinical course and management discussed.  Necessary edits performed.  I agree with the above.  Assessment and plan of care developed and discussed below.    Assessment/Plan: 60 year old male presenting with altered mental status.  Patient now extubated.  He is improved significantly from his initial presentation.  On antibiotics.  Some low grade fevers overnight.  Now afebrile.  Etiology of encephalopathy remains likely related to infection.  No further neurological work up recommended at this time.        Thana Farr, MD [facility] [phone]  01/15/2015  9:19 AM

## 2015-01-16 DIAGNOSIS — E0810 Diabetes mellitus due to underlying condition with ketoacidosis without coma: Secondary | ICD-10-CM

## 2015-01-16 LAB — CBC
HEMATOCRIT: 34 % — AB (ref 39.0–52.0)
HEMOGLOBIN: 11.6 g/dL — AB (ref 13.0–17.0)
MCH: 30.1 pg (ref 26.0–34.0)
MCHC: 34.1 g/dL (ref 30.0–36.0)
MCV: 88.1 fL (ref 78.0–100.0)
Platelets: 145 10*3/uL — ABNORMAL LOW (ref 150–400)
RBC: 3.86 MIL/uL — AB (ref 4.22–5.81)
RDW: 13.1 % (ref 11.5–15.5)
WBC: 6.7 10*3/uL (ref 4.0–10.5)

## 2015-01-16 LAB — COMPREHENSIVE METABOLIC PANEL
ALBUMIN: 2.4 g/dL — AB (ref 3.5–5.0)
ALK PHOS: 46 U/L (ref 38–126)
ALT: 14 U/L — AB (ref 17–63)
AST: 18 U/L (ref 15–41)
Anion gap: 9 (ref 5–15)
BILIRUBIN TOTAL: 0.8 mg/dL (ref 0.3–1.2)
CALCIUM: 7.8 mg/dL — AB (ref 8.9–10.3)
CO2: 29 mmol/L (ref 22–32)
CREATININE: 0.7 mg/dL (ref 0.61–1.24)
Chloride: 103 mmol/L (ref 101–111)
GFR calc Af Amer: >60 mL/min (ref >60)
GLUCOSE: 248 mg/dL — AB (ref 65–99)
Potassium: 3.7 mmol/L (ref 3.5–5.1)
Sodium: 141 mmol/L (ref 135–145)
TOTAL PROTEIN: 4.8 g/dL — AB (ref 6.5–8.1)

## 2015-01-16 LAB — GLUCOSE, CAPILLARY
GLUCOSE-CAPILLARY: 249 mg/dL — AB (ref 65–99)
GLUCOSE-CAPILLARY: 210 mg/dL — AB (ref 65–99)
GLUCOSE-CAPILLARY: 169 mg/dL — AB (ref 65–99)
Glucose-Capillary: 223 mg/dL — ABNORMAL HIGH (ref 65–99)
Glucose-Capillary: 248 mg/dL — ABNORMAL HIGH (ref 65–99)
Glucose-Capillary: 154 mg/dL — ABNORMAL HIGH (ref 65–99)

## 2015-01-16 LAB — CSF CULTURE: CULTURE: NO GROWTH

## 2015-01-16 LAB — CSF CULTURE W GRAM STAIN

## 2015-01-16 MED ORDER — RISPERIDONE 0.5 MG PO TABS
0.5000 mg | ORAL_TABLET | Freq: Two times a day (BID) | ORAL | Status: DC
  Administered 2015-01-16 (×2): 0.5 mg via ORAL
  Filled 2015-01-16 (×4): qty 1

## 2015-01-16 NOTE — Progress Notes (Signed)
PULMONARY / CRITICAL CARE MEDICINE   Name: Tony French. Ager MRN: [id number] DOB: July 24, 1955    ADMISSION DATE:  01/07/2015  REFERRING MD :  [facility]  CHIEF COMPLAINT:  AMS  INITIAL PRESENTATION:   59 yo male presented to [facility] with DKA.  Transferred to [facility] on 8/16.  Intubated for airway protection in setting of encephalopathy.  STUDIES:  8/17 EEG >> general slowing 8/17 CT head >> no acute finding 8/17 Abd u/s >> negative exam 8/17 LP >> WBC 1, RBC 225, glucose 167, protein 54 8/18 MRI brain >> no acute process  SIGNIFICANT EVENTS: 8/13 - admitted to [facility] with DKA. 8/16 - transferred to [facility] ICU.  Intubated for airway protection. 8/18 - extubated; started on precedex.  8/19 more awake. Progressing   SUBJECTIVE:  Groggy in bed , following commands w/ slowed response  On precedex , unable to wean due to agitation per RN  BS tr down   VITAL SIGNS: Temp:  [98.1 F (36.7 C)-99.8 F (37.7 C)] 98.1 F (36.7 C) (08/20 0800) Pulse Rate:  [80-94] 86 (08/20 0600) Resp:  [11-42] 21 (08/20 0600) BP: (129-160)/(71-91) 141/86 mmHg (08/20 0600) SpO2:  [92 %-100 %] 98 % (08/20 0600) HEMODYNAMICS:   VENTILATOR SETTINGS:   INTAKE / OUTPUT: Intake/Output      08/19 0701 - 08/20 0700 08/20 0701 - 08/21 0700   I.V. (mL/kg) 2156.5 (23.9)    NG/GT     IV Piggyback 500    Total Intake(mL/kg) 2656.5 (29.5)    Urine (mL/kg/hr) 3075 (1.4) 475 (2.2)   Total Output 3075 475   Net -418.5 -475          PHYSICAL EXAMINATION: General: resting in bed  Neuro: alert , slow responses but appropriate  Still on precedex.  HEENT: NCAT Cardiovascular: regular, no murmur Lungs: Decreased BS in bases , no accessory muscle use  Abdomen: soft, non tender Musculoskeletal: no edema  Skin: no rashes  LABS:  CBC  Recent Labs Lab 01/14/15 0248 01/15/15 0427 01/16/15 0553  WBC 9.5 7.0 6.7  HGB 11.6* 11.6* 11.6*  HCT 34.1* 33.4* 34.0*  PLT 130* 124* 145*   BMET  Recent  Labs Lab 01/14/15 0248 01/15/15 0427 01/16/15 0553  NA 144 142 141  K 4.5 3.7 3.7  CL 117* 109 103  CO2 22 21* 29  BUN 28* 13 <5*  CREATININE 1.09 0.88 0.70  GLUCOSE 332* 277* 248*   Electrolytes  Recent Labs Lab 01/13/15 0519 01/13/15 1240 01/13/15 2303 01/14/15 0248 01/15/15 0427 01/16/15 0553  CALCIUM 8.2*  --   --  7.9* 8.0* 7.8*  MG 1.7 1.6* 1.6*  --  1.6*  --   PHOS 2.3* 2.8 2.5  --   --   --    Sepsis Markers  Recent Labs Lab 01/13/15 0053 01/13/15 0111 01/13/15 0142 01/13/15 1240 01/14/15 0248  LATICACIDVEN 1.1  --  0.64  --   --   PROCALCITON  --  <0.10  --  <0.10 <0.10   ABG  Recent Labs Lab 01/13/15 0005 01/14/15 0250  PHART 7.433 7.330*  PCO2ART 32.3* 44.2  PO2ART 98.7 109.0*   Liver Enzymes  Recent Labs Lab 01/14/15 0248 01/15/15 0427 01/16/15 0553  AST 12* 14* 18  ALT 11* 12* 14*  ALKPHOS 43 46 46  BILITOT 0.5 0.8 0.8  ALBUMIN 2.6* 2.6* 2.4*   Cardiac Enzymes  Recent Labs Lab 01/13/15 0050 01/13/15 0519 01/13/15 1230  TROPONINI 0.41* 0.54* 0.40*  Glucose  Recent Labs Lab 01/15/15 1136 01/15/15 1524 01/15/15 1951 01/15/15 2322 01/16/15 0354 01/16/15 0838  GLUCAP 265* 253* 246* 202* 223* 249*    Imaging No results found.   ASSESSMENT / PLAN:  PULMONARY ETT 8/17 > 8/18 A: Compromised airway in setting of acute encephalopathy.>extbuated 8/18 w/ good sats  P:   Extubated 8/18 Monitor oxygenation Bronchial hygiene F/u CXR in am  Mobilize pt if able   CARDIOVASCULAR A:  Septic shock-->resolved  Demand ischemia. Hx of HTN. HLD. P:  Monitor hemodynamics Hold outpatient lisinopril, zetia Stress dose steroids 8/19   RENAL A:   Mild nonAG metabolic acidosis-->resolved  Hypokalemia. P:   Cont  D5 1/2 NS with 20 KCL at 75 ml/hr  GASTROINTESTINAL A:   Nutrition. P:   NPO-->clears when awake more  Pepcid for SUP  HEMATOLOGIC A:   Thrombocytopenia. P:  F/u CBC SQ heparin for DVT  prevention  INFECTIOUS A:   Sepsis with CON SA in blood-->likely contaminate  P:   Day 4 Abx, currently on primaxin/vancomycin  Blood 8/17 >> CSF 8/17 >>neg  CSF HSV 8/17 >>neg  Blood 8/19 >>  ENDOCRINE A:   DM with admission for DKA at [facility]. Relative adrenal insufficiency >> cortisol 10 from 8/17. P:   SSI with lantus Continue solu cortef, taper to off  NEUROLOGIC A:   Acute metabolic encephalopathy. Felt d/t acute infectious etiology  Reported hx of ETOH >> wife adamantly denies this. P:   Neurology signed off 8/19 PRN fentanyl PRN ativan for CIWA > 8 Continue thiamine, folic acid Continue to wean Precedex as able      Wife update at bedside.    Bookert Guzzi NP-C  [facility]  [phone]

## 2015-01-16 NOTE — Progress Notes (Signed)
PT Cancellation Note  Patient Details Name: Tony French. Hoque MRN: [id number] DOB: 1955/10/15   Cancelled Treatment:    Reason Eval/Treat Not Completed: Patient's level of consciousness, pt continues to be on precedex and asleep. Will check back later as time allows to see is status has changed.   Maricsa Sammons, [place] 01/16/2015, 10:12 AM

## 2015-01-16 NOTE — Progress Notes (Signed)
ANTIBIOTIC CONSULT NOTE Pharmacy Consult for Imipenem  Indication: Aspiration pneumonia  Allergies  Allergen Reactions  . Keflex [Cephalexin] Anaphylaxis and Rash    Rxn occurred around time of one of his knee surgeries. "He almost died" per wife  . Penicillins Anaphylaxis and Rash    Rxn occurred around time of one of his knee surgeries. "He almost died" per wife. Tolerates imipenem w/o issue.  . Statins Other (See Comments)    Myalgias    Labs:  Recent Labs  01/14/15 0248 01/15/15 0427 01/16/15 0553  WBC 9.5 7.0 6.7  HGB 11.6* 11.6* 11.6*  PLT 130* 124* 145*  CREATININE 1.09 0.88 0.70   Estimated Creatinine Clearance: 112.4 mL/min (by C-G formula based on Cr of 0.7). No results for input(s): VANCOTROUGH, VANCOPEAK, VANCORANDOM, GENTTROUGH, GENTPEAK, GENTRANDOM, TOBRATROUGH, TOBRAPEAK, TOBRARND, AMIKACINPEAK, AMIKACINTROU, AMIKACIN in the last 72 hours.   Microbiology: Recent Results (from the past 720 hour(s))  MRSA PCR Screening     Status: None   Collection Time: 01/13/15 12:05 AM  Result Value Ref Range Status   MRSA by PCR NEGATIVE NEGATIVE Final    Comment:        The GeneXpert MRSA Assay (FDA approved for NASAL specimens only), is one component of a comprehensive MRSA colonization surveillance program. It is not intended to diagnose MRSA infection nor to guide or monitor treatment for MRSA infections.   Culture, blood (routine x 2)     Status: None (Preliminary result)   Collection Time: 01/13/15  1:03 AM  Result Value Ref Range Status   Specimen Description BLOOD LEFT HAND  Final   Special Requests BOTTLES DRAWN AEROBIC ONLY 5CC  Final   Culture NO GROWTH 2 DAYS  Final   Report Status PENDING  Incomplete  Culture, blood (routine x 2)     Status: None   Collection Time: 01/13/15  1:12 AM  Result Value Ref Range Status   Specimen Description BLOOD LEFT ARM  Final   Special Requests   Final    BOTTLES DRAWN AEROBIC AND ANAEROBIC 10CC BLUE 7CC RED   Culture  Setup Time   Final    GRAM POSITIVE COCCI IN CLUSTERS IN BOTH AEROBIC AND ANAEROBIC BOTTLES CRITICAL RESULT CALLED TO, READ BACK BY AND VERIFIED WITH: Shirlee Limerick RN 1948 01/13/15 A BROWNING    Culture   Final    STAPHYLOCOCCUS SPECIES (COAGULASE NEGATIVE) THE SIGNIFICANCE OF ISOLATING THIS ORGANISM FROM A SINGLE SET OF BLOOD CULTURES WHEN MULTIPLE SETS ARE DRAWN IS UNCERTAIN. PLEASE NOTIFY THE MICROBIOLOGY DEPARTMENT WITHIN ONE WEEK IF SPECIATION AND SENSITIVITIES ARE REQUIRED.    Report Status 01/15/2015 FINAL  Final  CSF culture with Stat gram stain     Status: None   Collection Time: 01/13/15 11:58 AM  Result Value Ref Range Status   Specimen Description CSF  Final   Special Requests NO2 2CC  Final   Gram Stain   Final    CYTOSPIN SMEAR WBC PRESENT, PREDOMINANTLY MONONUCLEAR NO ORGANISMS SEEN    Culture NO GROWTH 3 DAYS  Final   Report Status 01/16/2015 FINAL  Final   Assessment:  66 YOM admitted to [facility] on 01/09/15 for DKA and transferred to [facility] ICU for lack of improvement in mental status despite resolution of DKA. Pt was extubated and has been very agitated without sedation. Precedex was restarted for potential EtOH w/d.  Pt is currently on Day 4 of imipenem for ?aspiration pneumonia Imipenem 8/17 >>   Cultures negative to date, low  grade fever, WBC trending down  Goal of Therapy:  Resolution of infection   Plan:  Continue Primaxin 500 mg iv Q 6 hours Follow up Scr, fever trend, progress  Thank you. Okey Regal, PharmD [phone]  01/16/2015,10:55 AM

## 2015-01-17 ENCOUNTER — Inpatient Hospital Stay (HOSPITAL_COMMUNITY): Payer: BLUE CROSS/BLUE SHIELD

## 2015-01-17 DIAGNOSIS — I469 Cardiac arrest, cause unspecified: Secondary | ICD-10-CM

## 2015-01-17 LAB — COMPREHENSIVE METABOLIC PANEL
ALT: 13 U/L — AB (ref 17–63)
AST: 20 U/L (ref 15–41)
Albumin: 2.2 g/dL — ABNORMAL LOW (ref 3.5–5.0)
Alkaline Phosphatase: 42 U/L (ref 38–126)
Anion gap: 7 (ref 5–15)
BUN: 7 mg/dL (ref 6–20)
CHLORIDE: 102 mmol/L (ref 101–111)
CO2: 29 mmol/L (ref 22–32)
CREATININE: 0.77 mg/dL (ref 0.61–1.24)
Calcium: 7.8 mg/dL — ABNORMAL LOW (ref 8.9–10.3)
Glucose, Bld: 149 mg/dL — ABNORMAL HIGH (ref 65–99)
POTASSIUM: 2.8 mmol/L — AB (ref 3.5–5.1)
SODIUM: 138 mmol/L (ref 135–145)
Total Bilirubin: 0.5 mg/dL (ref 0.3–1.2)
Total Protein: 4.6 g/dL — ABNORMAL LOW (ref 6.5–8.1)

## 2015-01-17 LAB — POCT I-STAT, CHEM 8
BUN: 8 mg/dL (ref 6–20)
CREATININE: 1 mg/dL (ref 0.61–1.24)
Calcium, Ion: 1.44 mmol/L — ABNORMAL HIGH (ref 1.12–1.23)
Chloride: 102 mmol/L (ref 101–111)
GLUCOSE: 464 mg/dL — AB (ref 65–99)
HCT: 35 % — ABNORMAL LOW (ref 39.0–52.0)
HEMOGLOBIN: 11.9 g/dL — AB (ref 13.0–17.0)
POTASSIUM: 3.1 mmol/L — AB (ref 3.5–5.1)
Sodium: 138 mmol/L (ref 135–145)
TCO2: 21 mmol/L (ref 0–100)

## 2015-01-17 LAB — CBC
HCT: 31.9 % — ABNORMAL LOW (ref 39.0–52.0)
Hemoglobin: 11.1 g/dL — ABNORMAL LOW (ref 13.0–17.0)
MCH: 30.7 pg (ref 26.0–34.0)
MCHC: 34.8 g/dL (ref 30.0–36.0)
MCV: 88.1 fL (ref 78.0–100.0)
PLATELETS: 151 10*3/uL (ref 150–400)
RBC: 3.62 MIL/uL — AB (ref 4.22–5.81)
RDW: 13 % (ref 11.5–15.5)
WBC: 6.2 10*3/uL (ref 4.0–10.5)

## 2015-01-17 LAB — GLUCOSE, CAPILLARY: GLUCOSE-CAPILLARY: 150 mg/dL — AB (ref 65–99)

## 2015-01-17 LAB — PHOSPHORUS: Phosphorus: 2.9 mg/dL (ref 2.5–4.6)

## 2015-01-17 LAB — MAGNESIUM: MAGNESIUM: 1.3 mg/dL — AB (ref 1.7–2.4)

## 2015-01-17 MED ORDER — TENECTEPLASE 50 MG IV KIT
50.0000 mg | PACK | Freq: Once | INTRAVENOUS | Status: AC
  Administered 2015-01-17: 50 mg via INTRAVENOUS
  Filled 2015-01-17: qty 10

## 2015-01-17 MED ORDER — HYDROCORTISONE NA SUCCINATE PF 100 MG IJ SOLR: 100.0000 mg | INTRAMUSCULAR | Status: DC

## 2015-01-17 MED ORDER — ETOMIDATE 2 MG/ML IV SOLN
INTRAVENOUS | Status: AC
  Filled 2015-01-17: qty 10

## 2015-01-17 MED ORDER — MAGNESIUM SULFATE 2 GM/50ML IV SOLN
INTRAVENOUS | Status: AC
  Administered 2015-01-17: 07:00:00
  Filled 2015-01-17: qty 50

## 2015-01-17 MED ORDER — EPINEPHRINE HCL 0.1 MG/ML IJ SOSY
PREFILLED_SYRINGE | INTRAMUSCULAR | Status: AC
  Filled 2015-01-17: qty 50

## 2015-01-17 MED ORDER — POTASSIUM CHLORIDE CRYS ER 20 MEQ PO TBCR
40.0000 meq | EXTENDED_RELEASE_TABLET | Freq: Once | ORAL | Status: AC
  Administered 2015-01-17: 40 meq via ORAL
  Filled 2015-01-17: qty 2

## 2015-01-17 MED ORDER — METOCLOPRAMIDE HCL 5 MG/ML IJ SOLN
5.0000 mg | Freq: Once | INTRAMUSCULAR | Status: AC
  Administered 2015-01-17: 5 mg via INTRAVENOUS
  Filled 2015-01-17: qty 1

## 2015-01-17 MED ORDER — SODIUM CHLORIDE 0.9 % IV BOLUS (SEPSIS)
1000.0000 mL | Freq: Once | INTRAVENOUS | Status: AC
  Administered 2015-01-17: 1000 mL via INTRAVENOUS

## 2015-01-17 MED ORDER — VECURONIUM BROMIDE 10 MG IV SOLR
INTRAVENOUS | Status: DC
Start: 2015-01-17 — End: 2015-01-17
  Filled 2015-01-17: qty 10

## 2015-01-18 ENCOUNTER — Other Ambulatory Visit: Payer: Self-pay | Admitting: Pulmonary Disease

## 2015-01-18 LAB — CULTURE, BLOOD (ROUTINE X 2): Culture: NO GROWTH

## 2015-01-18 MED FILL — Medication: Qty: 1 | Status: AC

## 2015-01-19 ENCOUNTER — Telehealth: Payer: Self-pay

## 2015-01-19 NOTE — Telephone Encounter (Signed)
This note was entered by mistake. °

## 2015-01-19 NOTE — Telephone Encounter (Signed)
On 01/19/2015 I received a death certificate from [place] and [facility]. The death certificate is for cremation. The patient is a patient of Doctor Sood. The death certificate will be taken to the pulmonary unit this am for signature. On 02-14-2015 I received the death certificate back from Doctor [place]. I got the death certificate ready and faxed the death certificate over to the funeral home per their request.

## 2015-01-20 LAB — CULTURE, BLOOD (ROUTINE X 2)
Culture: NO GROWTH
Culture: NO GROWTH

## 2015-01-25 ENCOUNTER — Telehealth: Payer: Self-pay

## 2015-01-25 NOTE — Telephone Encounter (Signed)
On 01/25/2015 I received a death certificate from [facility] & [facility]. The death certificate is for cremation. The death certificate will be taken to the pulmonary unit tomorrow am for signature. On 02-22-15 I received the death certificate back from Doctor [place]. I got the death certificate ready for pickup and called the funeral home to let them know it was ready for pickup.

## 2015-01-28 NOTE — Progress Notes (Signed)
01/03/2015 0400 Pt requested to get up and walk to bathroom for bm. Pt is high fall risk with multiple lines, intermittant agitation, and hallucinations as of 2 hours ago, etc. Pt offered bedpan. Pt unable to use bed pan. Pt offered bedside commode. Pt refused. Will continue reevaluate pt status and continue to offer [facility] as is unsafe for pt to ambulate to small bathroom at this time.

## 2015-01-28 NOTE — Progress Notes (Addendum)
eLink Physician-Brief Progress Note Patient Name: Tony French. Wisham DOB: [birth year] MRN: [id number]   Date of Service  2015/01/23  HPI/Events of Note   Recent Labs Lab 01/13/15 0050 01/13/15 0519 01/13/15 1240 01/13/15 2303 01/14/15 0248 01/15/15 0427 01/16/15 0553 01/23/2015 0426  NA 146* 143  --   --  144 142 141 138  K 3.5 3.2*  --   --  4.5 3.7 3.7 2.8*  CL 111 112*  --   --  117* 109 103 102  CO2 21* 21*  --   --  22 21* 29 29  GLUCOSE 311* 246*  --   --  332* 277* 248* 149*  BUN 8 10  --   --  28* 13 <5* 7  CREATININE 0.99 1.43*  --   --  1.09 0.88 0.70 0.77  CALCIUM 8.7* 8.2*  --   --  7.9* 8.0* 7.8* 7.8*  MG 1.7 1.7 1.6* 1.6*  --  1.6*  --   --   PHOS 2.4* 2.3* 2.8 2.5  --   --   --   --     RN also says patient has nausea and low bp after massive BM  eICU Interventions  kcl 40 po Check mag and phos Fluid bolus x 1 L stat -> Likely vasovagal reglan for nausea x 1 (QTc ok on ekg 01/13/15 and per RN on strip )     Intervention Category Intermediate Interventions: Electrolyte abnormality - evaluation and management  Rakeisha Nyce 2015/01/23, 5:51 AM

## 2015-01-28 NOTE — Progress Notes (Signed)
2015/02/12   Pt had massive bowel movement on BSC. When getting back to bed, pt had nausea and sweating. Subsequent BP was 59/44. Elink MD notified. 1000 ml bolus primed and started via pump but switched to pressure bag as only minimal improvement in BP 71/50. Reglan and Potassium replacement given per  MD. Pt still mentating at with slight confusion. Pt states "He feels better" and becomes unresponsive at 0628. Code [employer]. See Code Sheet.   Code called by Md Byrum. Time of death 0709. Family already notified of pt decompensating and in route. Family notified of pt's passing upon arrival. Potential Medical Autopsy to be preformed. Pathology Oncall MD Hillard notified.

## 2015-01-28 NOTE — Code Documentation (Signed)
CODE BLUE NOTE  Patient Name: Tony French   MRN: [id number]   Date of Birth/ Sex: February 17, 1956 , male      Admission Date: 01/13/2015  Attending Provider: Chilton Greathouse, MD  Primary Diagnosis: DKA ALTERED MENTAL STATUS    Indication: Pt was in his usual state of health until this AM, when he was straining to have a bowel movement, blood pressure dropped and he went unresponsive. His nurse had been pushing a bolus of fluids at the time. Code blue was subsequently called. At the time of arrival on scene, ACLS protocol was underway. Prior to code, patient had received 40 of K for a potassium of 2.8. Patient was in PEA throughout code. Over the course of the code, 9 mg epinephrine, bicarb, a Mg bolus, calcium, an amp of D50, 10 of insulin, norepinephrine gtt at 50, 100 hydrocortisone, TNKase 50 mg, and epinephrine drip at 1. Stat labs were normal (Na 138, K 3.1, CO2 21, Ca 1.44, Glucose 469). Differential diagnosis included MI and PE.    Technical Description:  - CPR performance duration:  41  minutes  - Was defibrillation or cardioversion used? No   - Was external pacer placed? No  - Was patient intubated pre/post CPR? Yes    Medications Administered: Y = Yes; Blank = No Amiodarone     Atropine    Calcium   Y  Epinephrine   Y x 9  Lidocaine    Magnesium   Y  Norepinephrine   Y gtt at 50  Phenylephrine    Sodium bicarbonate   Y  Vasopressin      Post CPR evaluation:  - Final Status - Was patient successfully resuscitated ? No   Miscellaneous Information:  - Time of death:  7:09  AM  - Primary team notified?  Yes, involved in code.   - Family Notified? Yes        Casey Burkitt, MD   02-07-2015, 7:21 AM

## 2015-01-28 NOTE — Procedures (Signed)
PCCM Code Blue Note  Pt was weaned off precedex overnight, was mentating and appeared stable.  Got up to bedside commode, experienced hypotension and possible vagal episode. Got back to bed and received NS bolus. Then experienced sudden loss of consciousness and loss of pulses. Initial rhythm PEA with a normal width tachycardia but suspected escape rhythm w some evidence for non-conducting p waves. He received multiple rounds epi, was started on norepi gtt, intubated. Received empiric Ca++ and treatment for hyperkalemia. Subsequent labs revealed no hyperK, borderline low CO2, no clear explanation for PEA. No ST elevations seen on ECG. After ~25 minutes CPR no pulse. Empiric TNK given and CPR continued. Unfortunately despite narrow complex rhythm on monitor pulse was never obtained. Code was called after 40+ minutes CPR.   Levy Pupa, MD, PhD 02/04/2015, 7:42 AM [facility] [phone] or if no answer [phone]

## 2015-01-28 NOTE — Progress Notes (Signed)
eLink Physician-Brief Progress Note Patient Name: Tony French. Eisenhardt DOB: 10-15-1955 MRN: [id number]   Date of Service  2015-01-28  HPI/Events of Note  Around 6.25am got alert from eRN that patient was in CPR. RN says that she ran the fluid wide open  Pre-arrest  Patient already had taken oral K    eICU Interventions  Called APP and MD Byrum stat to bedside  Labs showed mag to be low (just returned) -> ordered bolus mag replacement     Intervention Category Major Interventions: Code management / supervision  Rojelio Uhrich 01-28-2015, 6:44 AM

## 2015-01-28 NOTE — Progress Notes (Signed)
Chaplain responded to page for pt death. Chaplain introduced himself and his services to pt family. Chaplain facilitated storytelling, provided grief support, and offered prayer. This chaplain's shift is ending, chaplain will ask daytime shift chaplain to follow up. Page chaplain as needed.   01/23/2015 0800  Clinical Encounter Type  Visited With Family  Visit Type Spiritual support;Death  Referral From Nurse  Consult/Referral To Chaplain  Spiritual Encounters  Spiritual Needs Emotional;Grief support;Prayer  Stress Factors  Family Stress Factors Loss  Artin Mceuen, Mayer Masker, Chaplain 01/24/2015 8:27 AM

## 2015-01-28 DEATH — deceased

## 2015-02-27 NOTE — Discharge Summary (Signed)
PULMONARY / CRITICAL CARE MEDICINE   Name: Tony French. [name] MRN: [id number] DOB: 10/04/1955    ADMISSION DATE:  01-21-2015  Date of death January 26, 2015  Cause of death: acute cardiac arrest of uncertain etiology, autopsy results pending at the time of this dictation  REFERRING MD :  [facility]  CHIEF COMPLAINT:  AMS  INITIAL PRESENTATION:   59 yo male presented to [facility] with DKA.  Transferred to [facility] on 01/21/23.  Intubated for airway protection in setting of encephalopathy.  STUDIES:  8/17 EEG >> general slowing 8/17 CT head >> no acute finding 8/17 Abd u/s >> negative exam 8/17 LP >> WBC 1, RBC 225, glucose 167, protein 54 8/18 MRI brain >> no acute process  SIGNIFICANT EVENTS: 8/13 - admitted to [facility] with DKA. January 21, 2023 - transferred to [facility] ICU.  Intubated for airway protection. 8/18 - extubated; started on precedex.  8/19 more awake. Progressing   Hospital course:   Mr. Tony French was admitted to [facility] on 01-21-2015 after he had been transferred from [facility]. Notably, he had initially been admitted to the outside hospital with DKA. While there he developed worsening confusion which was treated with benzodiazepine's for presumed delirium tremens. He was transferred to [facility] on 01/21/15 for further evaluation. Upon arrival to [facility] hospital he was noted to be markedly encephalopathic and not protecting his airway so he required intubation. After intubation he underwent an extensive workup to try to define the cause of encephalopathy as detailed above. There was no clear infectious, epileptic, or vascular cause identified. His metabolic derangements from DKA were treated and eventually he was extubated on 01/14/2015. However, his encephalopathy did persist and he was treated with Precedex. For the duration of his ICU stay the Precedex was weaned down and on 01/16/2015 the Precedex was successfully discontinued and he was able to converse and  ambulate. Early in the morning on 2015-01-26 he has to get up to go to the bedside commode and shortly thereafter he had the sudden onset of syncope with a cardiac arrest. During his arrest he had bradycardia and hypotension. The ICU team responded immediately and perform CPR. Intensivist came to the bedside and assisted with CPR. After reviewing the chart the decision was made to empirically treat him for pulmonary embolism as there is no other clear cause of his acute cardiac arrest. He was administered thrombolytic therapy during CPR but unfortunately he was never able to regain a spontaneous pulse. CPR efforts were ongoing for near 45 minutes but were ultimately unsuccessful after prolonged period of time. The patient was declared dead on January 26, 2015. The family was updated at the bedside and they did request an autopsy. As of today (02/23/2015) , the final results of his autopsy has been unavailable.  [name], MD [place] PCCM Pager: [phone] Cell: [phone] After 3pm or if no response, call [phone]

## 2017-05-22 IMAGING — CR DG CHEST 1V PORT
1 series · 1 of 1 positions shown · non-contrast
Comparison: 01/14/2015

CLINICAL DATA: Acute respiratory failure.  Recent extubation.

EXAM:
PORTABLE CHEST - 1 VIEW

[AP]
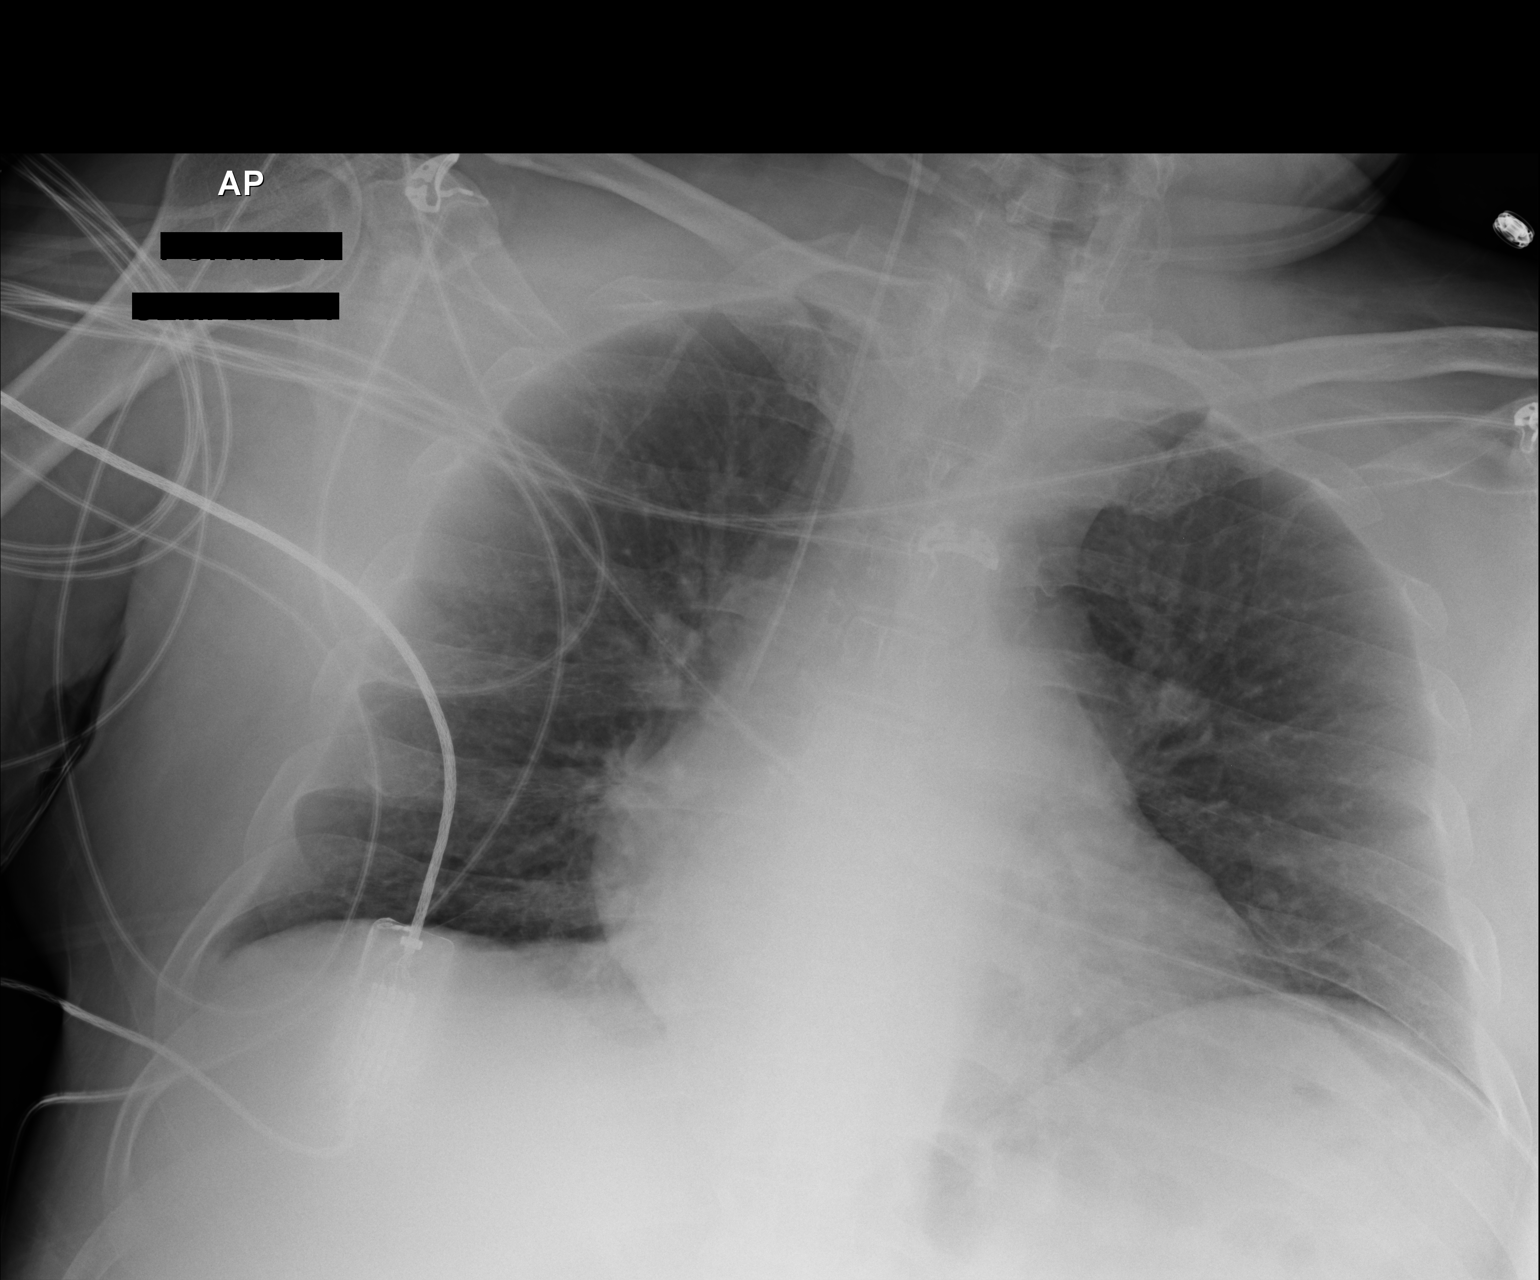

[1 of 1 positions shown; findings below may reference images not displayed]

FINDINGS: Endotracheal tube and nasogastric tube have been removed. Right
jugular central venous catheter remains in appropriate position.

Improved aeration of both lungs is seen, with resolution of
bibasilar atelectasis since prior exam. Both lungs are clear. Heart
size is normal. No evidence of pneumothorax or pleural effusion.
IMPRESSION: Resolution of bibasilar atelectasis since prior study. No active
disease.
